# Patient Record
Sex: Female | Born: 1995 | Race: Black or African American | Hispanic: No | Marital: Single | Smoking: Never smoker
Health system: Southern US, Community
[De-identification: ages and names within clinical notes are randomized; demographics above are authoritative.]

## PROBLEM LIST (undated history)

## (undated) DIAGNOSIS — F329 Major depressive disorder, single episode, unspecified: Secondary | ICD-10-CM

## (undated) DIAGNOSIS — F32A Depression, unspecified: Secondary | ICD-10-CM

## (undated) DIAGNOSIS — I2699 Other pulmonary embolism without acute cor pulmonale: Secondary | ICD-10-CM

## (undated) HISTORY — PX: NO PAST SURGERIES: SHX2092

---

## 2016-06-16 ENCOUNTER — Encounter: Payer: Self-pay | Admitting: Gynecology

## 2016-06-16 ENCOUNTER — Ambulatory Visit (INDEPENDENT_AMBULATORY_CARE_PROVIDER_SITE_OTHER): Payer: BLUE CROSS/BLUE SHIELD | Admitting: Gynecology

## 2016-06-16 VITALS — BP 120/78 | Ht 66.5 in | Wt 169.0 lb

## 2016-06-16 DIAGNOSIS — Z01419 Encounter for gynecological examination (general) (routine) without abnormal findings: Secondary | ICD-10-CM | POA: Diagnosis not present

## 2016-06-16 DIAGNOSIS — B3731 Acute candidiasis of vulva and vagina: Secondary | ICD-10-CM

## 2016-06-16 DIAGNOSIS — N898 Other specified noninflammatory disorders of vagina: Secondary | ICD-10-CM | POA: Diagnosis not present

## 2016-06-16 DIAGNOSIS — Z113 Encounter for screening for infections with a predominantly sexual mode of transmission: Secondary | ICD-10-CM

## 2016-06-16 DIAGNOSIS — B373 Candidiasis of vulva and vagina: Secondary | ICD-10-CM

## 2016-06-16 LAB — WET PREP FOR TRICH, YEAST, CLUE
CLUE CELLS WET PREP: NONE SEEN
Trich, Wet Prep: NONE SEEN

## 2016-06-16 LAB — CBC WITH DIFFERENTIAL/PLATELET
BASOS PCT: 0 %
Basophils Absolute: 0 cells/uL (ref 0–200)
EOS ABS: 284 {cells}/uL (ref 15–500)
Eosinophils Relative: 4 %
HEMATOCRIT: 37.9 % (ref 34.0–46.0)
HEMOGLOBIN: 12.5 g/dL (ref 11.5–15.3)
LYMPHS ABS: 1704 {cells}/uL (ref 1200–5200)
LYMPHS PCT: 24 %
MCH: 30.6 pg (ref 25.0–35.0)
MCHC: 33 g/dL (ref 31.0–36.0)
MCV: 92.9 fL (ref 78.0–98.0)
MONO ABS: 710 {cells}/uL (ref 200–900)
MPV: 10.9 fL (ref 7.5–12.5)
Monocytes Relative: 10 %
NEUTROS PCT: 62 %
Neutro Abs: 4402 cells/uL (ref 1800–8000)
Platelets: 197 10*3/uL (ref 140–400)
RBC: 4.08 MIL/uL (ref 3.80–5.10)
RDW: 13.3 % (ref 11.0–15.0)
WBC: 7.1 10*3/uL (ref 4.5–13.0)

## 2016-06-16 LAB — URINALYSIS W MICROSCOPIC + REFLEX CULTURE
BILIRUBIN URINE: NEGATIVE
Casts: NONE SEEN [LPF]
Crystals: NONE SEEN [HPF]
GLUCOSE, UA: NEGATIVE
HGB URINE DIPSTICK: NEGATIVE
KETONES UR: NEGATIVE
LEUKOCYTES UA: NEGATIVE
NITRITE: NEGATIVE
PH: 6 (ref 5.0–8.0)
PROTEIN: NEGATIVE
RBC / HPF: NONE SEEN RBC/HPF (ref <=2)
Specific Gravity, Urine: 1.031 (ref 1.001–1.035)

## 2016-06-16 MED ORDER — FLUCONAZOLE 150 MG PO TABS: 150.0000 mg | ORAL_TABLET | Freq: Once | ORAL | Status: DC

## 2016-06-16 NOTE — Patient Instructions (Addendum)
Breast Self-Awareness Practicing breast self-awareness may pick up problems early, prevent significant medical complications, and possibly save your life. By practicing breast self-awareness, you can become familiar with how your breasts look and feel and if your breasts are changing. This allows you to notice changes early. It can also offer you some reassurance that your breast health is good. One way to learn what is normal for your breasts and whether your breasts are changing is to do a breast self-exam. If you find a lump or something that was not present in the past, it is best to contact your caregiver right away. Other findings that should be evaluated by your caregiver include nipple discharge, especially if it is bloody; skin changes or reddening; areas where the skin seems to be pulled in (retracted); or new lumps and bumps. Breast pain is seldom associated with cancer (malignancy), but should also be evaluated by a caregiver. HOW TO PERFORM A BREAST SELF-EXAM The best time to examine your breasts is 5-7 days after your menstrual period is over. During menstruation, the breasts are lumpier, and it may be more difficult to pick up changes. If you do not menstruate, have reached menopause, or had your uterus removed (hysterectomy), you should examine your breasts at regular intervals, such as monthly. If you are breastfeeding, examine your breasts after a feeding or after using a breast pump. Breast implants do not decrease the risk for lumps or tumors, so continue to perform breast self-exams as recommended. Talk to your caregiver about how to determine the difference between the implant and breast tissue. Also, talk about the amount of pressure you should use during the exam. Over time, you will become more familiar with the variations of your breasts and more comfortable with the exam. A breast self-exam requires you to remove all your clothes above the waist.  Look at your breasts and nipples.  Stand in front of a mirror in a room with good lighting. With your hands on your hips, push your hands firmly downward. Look for a difference in shape, contour, and size from one breast to the other (asymmetry). Asymmetry includes puckers, dips, or bumps. Also, look for skin changes, such as reddened or scaly areas on the breasts. Look for nipple changes, such as discharge, dimpling, repositioning, or redness.  Carefully feel your breasts. This is best done either in the shower or tub while using soapy water or when flat on your back. Place the arm (on the side of the breast you are examining) above your head. Use the pads (not the fingertips) of your three middle fingers on your opposite hand to feel your breasts. Start in the underarm area and use  inch (2 cm) overlapping circles to feel your breast. Use 3 different levels of pressure (light, medium, and firm pressure) at each circle before moving to the next circle. The light pressure is needed to feel the tissue closest to the skin. The medium pressure will help to feel breast tissue a little deeper, while the firm pressure is needed to feel the tissue close to the ribs. Continue the overlapping circles, moving downward over the breast until you feel your ribs below your breast. Then, move one finger-width towards the center of the body. Continue to use the  inch (2 cm) overlapping circles to feel your breast as you move slowly up toward the collar bone (clavicle) near the base of the neck. Continue the up and down exam using all 3 pressures until you reach the  middle of the chest. Do this with each breast, carefully feeling for lumps or changes.   Keep a written record with breast changes or normal findings for each breast. By writing this information down, you do not need to depend only on memory for size, tenderness, or location. Write down where you are in your menstrual cycle, if you are still menstruating. Breast tissue can have some lumps or thick  tissue. However, see your caregiver if you find anything that concerns you.  SEEK MEDICAL CARE IF:  You see a change in shape, contour, or size of your breasts or nipples.   You see skin changes, such as reddened or scaly areas on the breasts or nipples.   You have an unusual discharge from your nipples.   You feel a new lump or unusually thick areas.    This information is not intended to replace advice given to you by your health care provider. Make sure you discuss any questions you have with your health care provider.   Document Released: 12/06/2005 Document Revised: 11/22/2012 Document Reviewed: 03/22/2012 Elsevier Interactive Patient Education 2016 Elsevier Inc. Fluconazole tablets What is this medicine? FLUCONAZOLE (floo KON na zole) is an antifungal medicine. It is used to treat certain kinds of fungal or yeast infections. This medicine may be used for other purposes; ask your health care provider or pharmacist if you have questions. What should I tell my health care provider before I take this medicine? They need to know if you have any of these conditions: -electrolyte abnormalities -history of irregular heart beat -kidney disease -an unusual or allergic reaction to fluconazole, other azole antifungals, medicines, foods, dyes, or preservatives -pregnant or trying to get pregnant -breast-feeding How should I use this medicine? Take this medicine by mouth. Follow the directions on the prescription label. Do not take your medicine more often than directed. Talk to your pediatrician regarding the use of this medicine in children. Special care may be needed. This medicine has been used in children as young as 746 months of age. Overdosage: If you think you have taken too much of this medicine contact a poison control center or emergency room at once. NOTE: This medicine is only for you. Do not share this medicine with others. What if I miss a dose? If you miss a dose, take  it as soon as you can. If it is almost time for your next dose, take only that dose. Do not take double or extra doses. What may interact with this medicine? Do not take this medicine with any of the following medications: -astemizole -certain medicines for irregular heart beat like dofetilide, dronedarone, quinidine -cisapride -erythromycin -lomitapide -other medicines that prolong the QT interval (cause an abnormal heart rhythm) -pimozide -terfenadine -thioridazine -tolvaptan -ziprasidone This medicine may also interact with the following medications: -antiviral medicines for HIV or AIDS -birth control pills -certain antibiotics like rifabutin, rifampin -certain medicines for blood pressure like amlodipine, isradipine, felodipine, hydrochlorothiazide, losartan, nifedipine -certain medicines for cancer like cyclophosphamide, vinblastine, vincristine -certain medicines for cholesterol like atorvastatin, lovastatin, fluvastatin, simvastatin -certain medicines for depression, anxiety, or psychotic disturbances like amitriptyline, midazolam, nortriptyline, triazolam -certain medicines for diabetes like glipizide, glyburide, tolbutamide -certain medicines for pain like alfentanil, fentanyl, methadone -certain medicines for seizures like carbamazepine, phenytoin -certain medicines that treat or prevent blood clots like warfarin -halofantrine -medicines that lower your chance of fighting infection like cyclosporine, prednisone, tacrolimus -NSAIDS, medicines for pain and inflammation, like celecoxib, diclofenac, flurbiprofen, ibuprofen, meloxicam, naproxen -other medicines  for fungal infections -sirolimus -theophylline -tofacitinib This list may not describe all possible interactions. Give your health care provider a list of all the medicines, herbs, non-prescription drugs, or dietary supplements you use. Also tell them if you smoke, drink alcohol, or use illegal drugs. Some items may  interact with your medicine. What should I watch for while using this medicine? Visit your doctor or health care professional for regular checkups. If you are taking this medicine for a long time you may need blood work. Tell your doctor if your symptoms do not improve. Some fungal infections need many weeks or months of treatment to cure. Alcohol can increase possible damage to your liver. Avoid alcoholic drinks. If you have a vaginal infection, do not have sex until you have finished your treatment. You can wear a sanitary napkin. Do not use tampons. Wear freshly washed cotton, not synthetic, panties. What side effects may I notice from receiving this medicine? Side effects that you should report to your doctor or health care professional as soon as possible: -allergic reactions like skin rash or itching, hives, swelling of the lips, mouth, tongue, or throat -dark urine -feeling dizzy or faint -irregular heartbeat or chest pain -redness, blistering, peeling or loosening of the skin, including inside the mouth -trouble breathing -unusual bruising or bleeding -vomiting -yellowing of the eyes or skin Side effects that usually do not require medical attention (report to your doctor or health care professional if they continue or are bothersome): -changes in how food tastes -diarrhea -headache -stomach upset or nausea This list may not describe all possible side effects. Call your doctor for medical advice about side effects. You may report side effects to FDA at 1-800-FDA-1088. Where should I keep my medicine? Keep out of the reach of children. Store at room temperature below 30 degrees C (86 degrees F). Throw away any medicine after the expiration date. NOTE: This sheet is a summary. It may not cover all possible information. If you have questions about this medicine, talk to your doctor, pharmacist, or health care provider.    2016, Elsevier/Gold Standard. (2013-07-14 16:13:04) Monilial  Vaginitis Vaginitis in a soreness, swelling and redness (inflammation) of the vagina and vulva. Monilial vaginitis is not a sexually transmitted infection. CAUSES  Yeast vaginitis is caused by yeast (candida) that is normally found in your vagina. With a yeast infection, the candida has overgrown in number to a point that upsets the chemical balance. SYMPTOMS   White, thick vaginal discharge.  Swelling, itching, redness and irritation of the vagina and possibly the lips of the vagina (vulva).  Burning or painful urination.  Painful intercourse. DIAGNOSIS  Things that may contribute to monilial vaginitis are:  Postmenopausal and virginal states.  Pregnancy.  Infections.  Being tired, sick or stressed, especially if you had monilial vaginitis in the past.  Diabetes. Good control will help lower the chance.  Birth control pills.  Tight fitting garments.  Using bubble bath, feminine sprays, douches or deodorant tampons.  Taking certain medications that kill germs (antibiotics).  Sporadic recurrence can occur if you become ill. TREATMENT  Your caregiver will give you medication.  There are several kinds of anti monilial vaginal creams and suppositories specific for monilial vaginitis. For recurrent yeast infections, use a suppository or cream in the vagina 2 times a week, or as directed.  Anti-monilial or steroid cream for the itching or irritation of the vulva may also be used. Get your caregiver's permission.  Painting the vagina with methylene blue  solution may help if the monilial cream does not work.  Eating yogurt may help prevent monilial vaginitis. HOME CARE INSTRUCTIONS   Finish all medication as prescribed.  Do not have sex until treatment is completed or after your caregiver tells you it is okay.  Take warm sitz baths.  Do not douche.  Do not use tampons, especially scented ones.  Wear cotton underwear.  Avoid tight pants and panty hose.  Tell your  sexual partner that you have a yeast infection. They should go to their caregiver if they have symptoms such as mild rash or itching.  Your sexual partner should be treated as well if your infection is difficult to eliminate.  Practice safer sex. Use condoms.  Some vaginal medications cause latex condoms to fail. Vaginal medications that harm condoms are:  Cleocin cream.  Butoconazole (Femstat).  Terconazole (Terazol) vaginal suppository.  Miconazole (Monistat) (may be purchased over the counter). SEEK MEDICAL CARE IF:   You have a temperature by mouth above 102 F (38.9 C).  The infection is getting worse after 2 days of treatment.  The infection is not getting better after 3 days of treatment.  You develop blisters in or around your vagina.  You develop vaginal bleeding, and it is not your menstrual period.  You have pain when you urinate.  You develop intestinal problems.  You have pain with sexual intercourse.   This information is not intended to replace advice given to you by your health care provider. Make sure you discuss any questions you have with your health care provider.   Document Released: 09/15/2005 Document Revised: 02/28/2012 Document Reviewed: 06/09/2015 Elsevier Interactive Patient Education [employer]2016 Elsevier Inc.

## 2016-06-16 NOTE — Progress Notes (Signed)
Victoria Sanders [birth year] [id number]   History:    20 y.o.  who presents to the office today for her first gynecological exam. Patient is sexually active. She's had 4 partners in the past. She reports normal menstrual cycles lasting 3-4 days. She sometimes uses condoms for contraception. She was here also complaining of a clear vaginal discharge without any odor and some slight vulvar pruritus. She is otherwise with no other medical problems. She has never received the HPV vaccine series in the past.  Past medical history,surgical history, family history and social history were all reviewed and documented in the EPIC chart.  Gynecologic History Patient's last menstrual period was 05/16/2016. Contraception: none Last Pap: Not indicated. Results were: Not indicated Last mammogram: Not indicated. Results were: Not indicated  Obstetric History OB History  Gravida Para Term Preterm AB SAB TAB Ectopic Multiple Living  0 0 0 0 0 0 0 0 0 0          ROS: A ROS was performed and pertinent positives and negatives are included in the history.  GENERAL: No fevers or chills. HEENT: No change in vision, no earache, sore throat or sinus congestion. NECK: No pain or stiffness. CARDIOVASCULAR: No chest pain or pressure. No palpitations. PULMONARY: No shortness of breath, cough or wheeze. GASTROINTESTINAL: No abdominal pain, nausea, vomiting or diarrhea, melena or bright red blood per rectum. GENITOURINARY: No urinary frequency, urgency, hesitancy or dysuria. MUSCULOSKELETAL: No joint or muscle pain, no back pain, no recent trauma. DERMATOLOGIC: No rash, no itching, no lesions. ENDOCRINE: No polyuria, polydipsia, no heat or cold intolerance. No recent change in weight. HEMATOLOGICAL: No anemia or easy bruising or bleeding. NEUROLOGIC: No headache, seizures, numbness, tingling or weakness. PSYCHIATRIC: No depression, no loss of interest in normal activity or change in sleep pattern.     Exam: chaperone  present  BP 120/78 mmHg  Ht 5' 6.5" (1.689 m)  Wt 169 lb (76.658 kg)  BMI 26.87 kg/m2  LMP 05/16/2016  Body mass index is 26.87 kg/(m^2).  General appearance : Well developed well nourished female. No acute distress HEENT: Eyes: no retinal hemorrhage or exudates,  Neck supple, trachea midline, no carotid bruits, no thyroidmegaly Lungs: Clear to auscultation, no rhonchi or wheezes, or rib retractions  Heart: Regular rate and rhythm, no murmurs or gallops Breast:Examined in sitting and supine position were symmetrical in appearance, no palpable masses or tenderness,  no skin retraction, no nipple inversion, no nipple discharge, no skin discoloration, no axillary or supraclavicular lymphadenopathy Abdomen: no palpable masses or tenderness, no rebound or guarding Extremities: no edema or skin discoloration or tenderness  Pelvic:  Bartholin, Urethra, Skene Glands: Within normal limits             Vagina: Clear discharge  Cervix: No gross lesions or discharge  Uterus  anteverted, normal size, shape and consistency, non-tender and mobile  Adnexa  Without masses or tenderness  Anus and perineum  normal   Rectovaginal  normal sphincter tone without palpated masses or tenderness             Hemoccult not indicated   Wet prep moderate yeast, few WBC, few bacteria  Assessment/Plan:  20 y.o. female for annual exam with yeast vaginitis will be prescribed Diflucan 150 mg 1 by mouth today. A GC and Chlamydia culture was obtained and to complete a full STD screen we'll obtain an HIV, RPR, hepatitis B and C today. Patient with several tattoos on her back and also body piercing.  Pap smear not indicated. A CBC and urinalysis will be done today. Patient was given instruction monthly self breast examination. We discussed different types of contraception. Literature information was provided on the [place] IUD as well as on the HPV vaccine series.   Ok EdwardsFERNANDEZ,[name] MD, 9:52 AM 06/16/2016

## 2016-06-17 LAB — GC/CHLAMYDIA PROBE AMP
CT Probe RNA: NOT DETECTED
GC Probe RNA: NOT DETECTED

## 2016-06-17 LAB — URINE CULTURE

## 2016-06-17 LAB — HEPATITIS C ANTIBODY: HCV Ab: NEGATIVE

## 2016-06-17 LAB — HEPATITIS B SURFACE ANTIGEN: Hepatitis B Surface Ag: NEGATIVE

## 2016-06-17 LAB — RPR

## 2016-06-17 LAB — HIV ANTIBODY (ROUTINE TESTING W REFLEX): HIV 1&2 Ab, 4th Generation: NONREACTIVE

## 2016-06-28 ENCOUNTER — Telehealth: Payer: Self-pay | Admitting: Gynecology

## 2016-06-28 NOTE — Telephone Encounter (Signed)
06/28/16-[facility] with a $25 copay. Advised insertion while on cycle. Also, pt wanted info on Gardisil vaccination. Her BC will cover the shots with $25 copay for each injection. If one is done same day as Skyla insert, there would be 2-$25 copays. Pt was given this information and will call if she wishes to proceed. Per [id number]/[email or web address]

## 2016-11-13 ENCOUNTER — Encounter (HOSPITAL_COMMUNITY): Payer: Self-pay | Admitting: *Deleted

## 2016-11-13 ENCOUNTER — Inpatient Hospital Stay (HOSPITAL_COMMUNITY)
Admission: AD | Admit: 2016-11-13 | Discharge: 2016-11-16 | DRG: 885 | Disposition: A | Discharge status: Home / Self Care | Payer: Federal, State, Local not specified - Other | Source: Intra-hospital | Attending: Psychiatry | Admitting: Psychiatry

## 2016-11-13 ENCOUNTER — Emergency Department (HOSPITAL_COMMUNITY)
Admission: EM | Admit: 2016-11-13 | Discharge: 2016-11-13 | Disposition: A | Discharge status: Home / Self Care | Payer: Self-pay | Attending: Emergency Medicine | Admitting: Emergency Medicine

## 2016-11-13 DIAGNOSIS — F329 Major depressive disorder, single episode, unspecified: Secondary | ICD-10-CM | POA: Insufficient documentation

## 2016-11-13 DIAGNOSIS — G4700 Insomnia, unspecified: Secondary | ICD-10-CM | POA: Diagnosis present

## 2016-11-13 DIAGNOSIS — Z79899 Other long term (current) drug therapy: Secondary | ICD-10-CM | POA: Insufficient documentation

## 2016-11-13 DIAGNOSIS — Z833 Family history of diabetes mellitus: Secondary | ICD-10-CM

## 2016-11-13 DIAGNOSIS — R45851 Suicidal ideations: Secondary | ICD-10-CM | POA: Insufficient documentation

## 2016-11-13 DIAGNOSIS — Z801 Family history of malignant neoplasm of trachea, bronchus and lung: Secondary | ICD-10-CM | POA: Diagnosis not present

## 2016-11-13 DIAGNOSIS — Z809 Family history of malignant neoplasm, unspecified: Secondary | ICD-10-CM

## 2016-11-13 DIAGNOSIS — Z808 Family history of malignant neoplasm of other organs or systems: Secondary | ICD-10-CM | POA: Diagnosis not present

## 2016-11-13 DIAGNOSIS — Z823 Family history of stroke: Secondary | ICD-10-CM | POA: Diagnosis not present

## 2016-11-13 DIAGNOSIS — F32A Depression, unspecified: Secondary | ICD-10-CM

## 2016-11-13 DIAGNOSIS — F332 Major depressive disorder, recurrent severe without psychotic features: Principal | ICD-10-CM | POA: Diagnosis present

## 2016-11-13 HISTORY — DX: Depression, unspecified: F32.A

## 2016-11-13 HISTORY — DX: Major depressive disorder, single episode, unspecified: F32.9

## 2016-11-13 LAB — CBC
HEMATOCRIT: 41.1 % (ref 36.0–46.0)
HEMOGLOBIN: 13.9 g/dL (ref 12.0–15.0)
MCH: 31.4 pg (ref 26.0–34.0)
MCHC: 33.8 g/dL (ref 30.0–36.0)
MCV: 92.8 fL (ref 78.0–100.0)
Platelets: 182 10*3/uL (ref 150–400)
RBC: 4.43 MIL/uL (ref 3.87–5.11)
RDW: 12 % (ref 11.5–15.5)
WBC: 5.8 10*3/uL (ref 4.0–10.5)

## 2016-11-13 LAB — COMPREHENSIVE METABOLIC PANEL
ALBUMIN: 4.3 g/dL (ref 3.5–5.0)
ALK PHOS: 43 U/L (ref 38–126)
ALT: 14 U/L (ref 14–54)
AST: 17 U/L (ref 15–41)
Anion gap: 7 (ref 5–15)
BILIRUBIN TOTAL: 0.6 mg/dL (ref 0.3–1.2)
BUN: 16 mg/dL (ref 6–20)
CALCIUM: 9.2 mg/dL (ref 8.9–10.3)
CO2: 26 mmol/L (ref 22–32)
CREATININE: 0.8 mg/dL (ref 0.44–1.00)
Chloride: 106 mmol/L (ref 101–111)
GFR calc Af Amer: >60 mL/min (ref >60)
GFR calc non Af Amer: >60 mL/min (ref >60)
GLUCOSE: 89 mg/dL (ref 65–99)
Potassium: 4.1 mmol/L (ref 3.5–5.1)
SODIUM: 139 mmol/L (ref 135–145)
TOTAL PROTEIN: 6.9 g/dL (ref 6.5–8.1)

## 2016-11-13 LAB — RAPID URINE DRUG SCREEN, HOSP PERFORMED
Amphetamines: NOT DETECTED
BARBITURATES: NOT DETECTED
Benzodiazepines: NOT DETECTED
Cocaine: NOT DETECTED
Opiates: NOT DETECTED
TETRAHYDROCANNABINOL: NOT DETECTED

## 2016-11-13 LAB — ACETAMINOPHEN LEVEL: Acetaminophen (Tylenol), Serum: <10 ug/mL — ABNORMAL LOW (ref 10–30)

## 2016-11-13 LAB — SALICYLATE LEVEL: Salicylate Lvl: <7 mg/dL (ref 2.8–30.0)

## 2016-11-13 LAB — I-STAT BETA HCG BLOOD, ED (MC, WL, AP ONLY)

## 2016-11-13 LAB — ETHANOL: Alcohol, Ethyl (B): <5 mg/dL (ref <5)

## 2016-11-13 MED ORDER — ALUM & MAG HYDROXIDE-SIMETH 200-200-20 MG/5ML PO SUSP: 30.0000 mL | ORAL | Status: DC | PRN

## 2016-11-13 MED ORDER — ACETAMINOPHEN 325 MG PO TABS: 650.0000 mg | ORAL_TABLET | Freq: Four times a day (QID) | ORAL | Status: DC | PRN

## 2016-11-13 MED ORDER — HYDROXYZINE HCL 25 MG PO TABS
25.0000 mg | ORAL_TABLET | Freq: Three times a day (TID) | ORAL | Status: DC | PRN
  Filled 2016-11-13: qty 10

## 2016-11-13 MED ORDER — NICOTINE 21 MG/24HR TD PT24: 21.0000 mg | MEDICATED_PATCH | Freq: Every day | TRANSDERMAL | Status: DC | PRN

## 2016-11-13 MED ORDER — ZOLPIDEM TARTRATE 5 MG PO TABS: 5.0000 mg | ORAL_TABLET | Freq: Every evening | ORAL | Status: DC | PRN

## 2016-11-13 MED ORDER — IBUPROFEN 200 MG PO TABS: 600.0000 mg | ORAL_TABLET | Freq: Three times a day (TID) | ORAL | Status: DC | PRN

## 2016-11-13 MED ORDER — TRAZODONE HCL 50 MG PO TABS
50.0000 mg | ORAL_TABLET | Freq: Every evening | ORAL | Status: DC | PRN
  Filled 2016-11-13: qty 7

## 2016-11-13 MED ORDER — ONDANSETRON HCL 4 MG PO TABS: 4.0000 mg | ORAL_TABLET | Freq: Three times a day (TID) | ORAL | Status: DC | PRN

## 2016-11-13 MED ORDER — ACETAMINOPHEN 325 MG PO TABS: 650.0000 mg | ORAL_TABLET | ORAL | Status: DC | PRN

## 2016-11-13 MED ORDER — MAGNESIUM HYDROXIDE 400 MG/5ML PO SUSP: 30.0000 mL | Freq: Every day | ORAL | Status: DC | PRN

## 2016-11-13 NOTE — ED Notes (Signed)
Security at bedside wanding pt 
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
## 2016-11-13 NOTE — Tx Team (Signed)
Initial Treatment Plan 11/13/2016 6:00 PM [name] Kinchen [facility]:[id number]    PATIENT STRESSORS: Educational concerns Marital or family conflict Occupational concerns   PATIENT STRENGTHS: Ability for insight Average or above average intelligence Communication skills General fund of knowledge Physical Health   PATIENT IDENTIFIED PROBLEMS: At risk for suicide  Depression  Anxiety  "Feeling better"  "I want to go home"             DISCHARGE CRITERIA:  Ability to meet basic life and health needs Improved stabilization in mood, thinking, and/or behavior Motivation to continue treatment in a less acute level of care Need for constant or close observation no longer present  PRELIMINARY DISCHARGE PLAN: Outpatient therapy Return to previous living arrangement Return to previous work or school arrangements  PATIENT/FAMILY INVOLVEMENT: This treatment plan has been presented to and reviewed with the patient, [name] Schnepf.  The patient and family have been given the opportunity to ask questions and make suggestions.  Carleene OverlieMiddleton, Deedee Lybarger P, RN 11/13/2016, 6:00 PM

## 2016-11-13 NOTE — ED Triage Notes (Signed)
Pt is tearful at triage. Reports having depression since 20yo and having suicidal thoughts today but denies any attempts.

## 2016-11-13 NOTE — ED Notes (Signed)
Faxed voluntary commitment form

## 2016-11-13 NOTE — Progress Notes (Signed)
D    Saw pt in her room briefly   She is appropriate and pleasant   She endorses depression and anxiety   She interacts appropriately with others A    Verbal support given   Medications administered and effectiveness monitored    Q 15 min checks R   Pt safe at present

## 2016-11-13 NOTE — ED Notes (Signed)
Attempted report x1. 

## 2016-11-13 NOTE — ED Notes (Signed)
Transport called - [facility] to arrive in 20 mins

## 2016-11-13 NOTE — ED Notes (Signed)
[facility] arrived. Patient picked up.

## 2016-11-13 NOTE — ED Notes (Signed)
Security notified to wand patient.  

## 2016-11-13 NOTE — ED Notes (Signed)
Attempted report x 2 

## 2016-11-13 NOTE — ED Notes (Signed)
Sitter at bedside.
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
## 2016-11-13 NOTE — ED Provider Notes (Signed)
[facility]-EMERGENCY DEPT Provider Note   CSN: [id number] Arrival date & time: 11/13/16  [id number]     History   Chief Complaint Chief Complaint  Patient presents with  . Suicidal  . Depression    HPI Victoria Sanders is a 20 y.o. female.  She presents for evaluation of "depression". She states that it is "getting worse". She last saw her therapist about 2 months ago, but stopped because she "did not like it". She lives with her parents. She goes to school and is employed. She has a boyfriend, but admits that she is not getting along with him. She has had thoughts of suicide, but has no plan. She has no thoughts of hurting anyone else. She is not taking medicine for depression. Depression has been an ongoing problem for 5 years. There are no other known modifying factors.     HPI  Past Medical History:  Diagnosis Date  . Depression     There are no active problems to display for this patient.   History reviewed. No pertinent surgical history.  OB History    Gravida Para Term Preterm AB Living   0 0 0 0 0 0   SAB TAB Ectopic Multiple Live Births   0 0 0 0         Home Medications    Prior to Admission medications   Medication Sig Start Date End Date Taking? Authorizing Provider  fluconazole (DIFLUCAN) 150 MG tablet Take 1 tablet (150 mg total) by mouth once. Patient not taking: Reported on 11/13/2016 06/16/16   Ok EdwardsJuan H Fernandez, MD    Family History Family History  Problem Relation Age of Onset  . Diabetes Maternal Uncle   . Stroke Maternal Uncle   . Cancer Paternal Grandmother     BRAIN TUMOR   . Diabetes Paternal Grandmother   . Cancer Paternal Grandfather     LUNG     Social History Social History  Substance Use Topics  . Smoking status: Never Smoker  . Smokeless tobacco: Not on file  . Alcohol use No     Allergies   Patient has no known allergies.   Review of Systems Review of Systems  All other systems reviewed and are negative.    Physical  Exam Updated Vital Signs BP 122/69 (BP Location: Right Arm)   Pulse 70   Temp 98.4 F (36.9 C) (Oral)   Resp 18   Ht 5\' 7"  (1.702 m)   Wt 160 lb (72.6 kg)   LMP 10/14/2016   SpO2 100%   BMI 25.06 kg/m   Physical Exam  Constitutional: She is oriented to person, place, and time. She appears well-developed and well-nourished.  HENT:  Head: Normocephalic and atraumatic.  Right Ear: External ear normal.  Left Ear: External ear normal.  Eyes: Conjunctivae and EOM are normal. Pupils are equal, round, and reactive to light.  Neck: Normal range of motion and phonation normal. Neck supple.  Cardiovascular: Normal rate.   Pulmonary/Chest: Effort normal. She exhibits no bony tenderness.  Musculoskeletal: Normal range of motion.  Neurological: She is alert and oriented to person, place, and time. No cranial nerve deficit or sensory deficit. She exhibits normal muscle tone. Coordination normal.  Skin: Skin is warm, dry and intact.  Psychiatric: Her behavior is normal. Judgment and thought content normal.  Appears depressed. Speaks quietly, but is cooperative. No overt psychosis.  Nursing note and vitals reviewed.    ED Treatments / Results  Labs (all labs ordered are  listed, but only abnormal results are displayed) Labs Reviewed  ACETAMINOPHEN LEVEL - Abnormal; Notable for the following:       Result Value   Acetaminophen (Tylenol), Serum <10 (*)    All other components within normal limits  COMPREHENSIVE METABOLIC PANEL  ETHANOL  SALICYLATE LEVEL  CBC  RAPID URINE DRUG SCREEN, HOSP PERFORMED  I-STAT BETA HCG BLOOD, ED ([facility], [facility], [facility] ONLY)    EKG  EKG Interpretation None       Radiology No results found.  Procedures Procedures (including critical care time)  Medications Ordered in ED Medications  acetaminophen (TYLENOL) tablet 650 mg (not administered)  ibuprofen (ADVIL,MOTRIN) tablet 600 mg (not administered)  zolpidem (AMBIEN) tablet 5 mg (not administered)    nicotine (NICODERM CQ - dosed in mg/24 hours) patch 21 mg (not administered)  ondansetron (ZOFRAN) tablet 4 mg (not administered)  alum & mag hydroxide-simeth (MAALOX/MYLANTA) 200-200-20 MG/5ML suspension 30 mL (not administered)     Initial Impression / Assessment and Plan / ED Course  I have reviewed the triage vital signs and the nursing notes.  Pertinent labs & imaging results that were available during my care of the patient were reviewed by me and considered in my medical decision making (see chart for details).  Clinical Course     Medications  acetaminophen (TYLENOL) tablet 650 mg (not administered)  ibuprofen (ADVIL,MOTRIN) tablet 600 mg (not administered)  zolpidem (AMBIEN) tablet 5 mg (not administered)  nicotine (NICODERM CQ - dosed in mg/24 hours) patch 21 mg (not administered)  ondansetron (ZOFRAN) tablet 4 mg (not administered)  alum & mag hydroxide-simeth (MAALOX/MYLANTA) 200-200-20 MG/5ML suspension 30 mL (not administered)    Patient Vitals for the past 24 hrs:  BP Temp Temp src Pulse Resp SpO2 Height Weight  11/13/16 1032 122/69 - - 70 18 100 % - -  11/13/16 1006 125/82 98.4 F (36.9 C) Oral 83 18 100 % 5\' 7"  (1.702 m) 160 lb (72.6 kg)    12:04 PM Reevaluation with update and discussion. After initial assessment and treatment, an updated evaluation reveals No change in clinical status. Patient and family updated on findings.Mancel Bale. Adonnis Salceda L   TTS consultation   Final Clinical Impressions(s) / ED Diagnoses   Final diagnoses:  Suicidal ideation  Depression, unspecified depression type    Untreated depression, with  Suicidal ideation, and psychosocial stressors.  Nursing Notes Reviewed/ Care Coordinated, and agree without changes. Applicable Imaging Reviewed.  Interpretation of Laboratory Data incorporated into ED treatment  Plan- as per her TTS in conjunction with oncoming provider team  New Prescriptions New Prescriptions   No medications on  file     Mancel BaleElliott Blakeley Margraf, MD 11/13/16 1206

## 2016-11-13 NOTE — [facility] Group Notes (Signed)
Pt attended group. Rules of the of the unit were explained. Pt was asked how was your day on a scale of 1-10. Pt stated it was a 9 just seeing family.

## 2016-11-14 DIAGNOSIS — Z833 Family history of diabetes mellitus: Secondary | ICD-10-CM

## 2016-11-14 DIAGNOSIS — Z823 Family history of stroke: Secondary | ICD-10-CM

## 2016-11-14 DIAGNOSIS — F332 Major depressive disorder, recurrent severe without psychotic features: Principal | ICD-10-CM

## 2016-11-14 DIAGNOSIS — Z79899 Other long term (current) drug therapy: Secondary | ICD-10-CM

## 2016-11-14 DIAGNOSIS — Z801 Family history of malignant neoplasm of trachea, bronchus and lung: Secondary | ICD-10-CM

## 2016-11-14 DIAGNOSIS — Z808 Family history of malignant neoplasm of other organs or systems: Secondary | ICD-10-CM

## 2016-11-14 DIAGNOSIS — R45851 Suicidal ideations: Secondary | ICD-10-CM

## 2016-11-14 MED ORDER — ESCITALOPRAM OXALATE 10 MG PO TABS
10.0000 mg | ORAL_TABLET | Freq: Every day | ORAL | Status: DC
  Administered 2016-11-14 – 2016-11-16 (×3): 10 mg via ORAL
  Filled 2016-11-14 (×3): qty 1
  Filled 2016-11-14: qty 7
  Filled 2016-11-14: qty 1

## 2016-11-14 NOTE — [facility] Group Notes (Signed)
Adult Therapy Group Note  Date: 11/14/2016  Time:  9:00-10:00AM  Group Topic/Focus: Healthy Press photographerupport Systems   Building Self Esteem:   The focus of this group was to assist patients in identifying their current healthy and unhealthy supports, then discussing how to add additional healthy supports and reduce existing unhealthy ones.  Some group members were willing to read their "goodbye" letters assigned yesterday, which were goodbye's to whatever unhealthy coping technique they have been thinking about changing.  As they did this, CSW asked the group to work with the reader on identifying healthy supports that could help to achieve the desired change.  The healthy additions included supports such as 12-step groups, individual therapy, psychiatrists, faith activities,  sponsors, group therapy, support groups, classes on mental health, exercise/gym, meditation practice, and more.    Participation Level:  Active  Participation Quality:  Attentive and Sharing  Affect:  Blunted  Cognitive:  Appropriate  Insight: Good  Engagement in Group:  Engaged  Modes of Intervention:  Discussion and Support  Additional Comments:  The patient expressed that a current healthy support is her sister and her parents are unhealthy for her.  She did not talk much.  Lynnell ChadMareida J Grossman-Orr, LCSW 11/14/2016  3:40 PM
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
## 2016-11-14 NOTE — H&P (Signed)
Psychiatric Admission Assessment Adult  Patient Identification: Victoria Sanders MRN:  [id number] Date of Evaluation:  11/14/2016 Chief Complaint:  MDD, severe without psychosis Principal Diagnosis: <principal problem not specified> Diagnosis:   Patient Active Problem List   Diagnosis Date Noted  . MDD (major depressive disorder), recurrent severe, without psychosis ([place]) [F33.2] 11/13/2016   History of Present Illness: Patient is a 20 year old female who is brought in by her mother as patient was experiencing severe depression and having suicidal thoughts.  She admitted that she has symptoms of depression for past few months but lately it is getting worse and intense.  She is feeling hopeless, worthless, isolated, withdrawn and decreased energy.  She has multiple stressors including overwhelming school, stressful job and recently family issues.  Patient lives with her parents but they are currently separated and in the holiday she felt more depressed because parents are not talking to each other.  She admitted poor sleep, racing thoughts, anhedonia, feeling of hopelessness.  She admitted suicidal thoughts and they are ways that she thought about killing herself but she did not elaborate these plans.  Patient denies any paranoia, hallucination, mania, psychosis.  She has never seen psychiatrist and never took any antidepressant.  However she has been in therapy on and off a few months ago.  Patient denies drinking alcohol or using any illegal substances.  Her UDS is negative.  Her pregnancy test is negative.  She has no children and currently not in any relationship.  She is working at [employer] and also enrolled in a beauty school. Associated Signs/Symptoms: Depression Symptoms:  depressed mood, anhedonia, insomnia, fatigue, feelings of worthlessness/guilt, difficulty concentrating, hopelessness, recurrent thoughts of death, suicidal thoughts without plan, anxiety, loss of  energy/fatigue, disturbed sleep, (Hypo) Manic Symptoms:  Irritable Mood, Anxiety Symptoms:  Excessive Worry, Psychotic Symptoms:  Patient denies any psychotic symptoms PTSD Symptoms: NA Total Time spent with patient: 45 minutes  Past Psychiatric History: Patient denies any previous history of psychiatric inpatient treatment, suicidal attempt, mania, psychosis or any hallucination.  She has never taken any psychotropic medication.  Is the patient at risk to self? Yes.    Has the patient been a risk to self in the past 6 months? Yes.    Has the patient been a risk to self within the distant past? No.  Is the patient a risk to others? No.  Has the patient been a risk to others in the past 6 months? No.  Has the patient been a risk to others within the distant past? No.   Prior Inpatient Therapy:   Prior Outpatient Therapy:    Alcohol Screening: 1. How often do you have a drink containing alcohol?: Never 9. Have you or someone else been injured as a result of your drinking?: No 10. Has a relative or friend or a doctor or another health worker been concerned about your drinking or suggested you cut down?: No Alcohol Use Disorder Identification Test Final Score (AUDIT): 0 Brief Intervention: AUDIT score less than 7 or less-screening does not suggest unhealthy drinking-brief intervention not indicated Substance Abuse History in the last 12 months:  No. Consequences of Substance Abuse: Negative Previous Psychotropic Medications: No  Psychological Evaluations: No  Past Medical History:  Past Medical History:  Diagnosis Date  . Depression    History reviewed. No pertinent surgical history. Family History:  Family History  Problem Relation Age of Onset  . Diabetes Maternal Uncle   . Stroke Maternal Uncle   .  Cancer Paternal Grandmother     BRAIN TUMOR   . Diabetes Paternal Grandmother   . Cancer Paternal Grandfather     LUNG    Family Psychiatric  History: Patient denies any  family history of psychiatric illness. Tobacco Screening: Have you used any form of tobacco in the last 30 days? (Cigarettes, Smokeless Tobacco, Cigars, and/or Pipes): No Social History:  History  Alcohol Use No     History  Drug Use No    Additional Social History:                           Allergies:  No Known Allergies Lab Results:  Results for orders placed or performed during the hospital encounter of 11/13/16 (from the past 48 hour(s))  Comprehensive metabolic panel     Status: None   Collection Time: 11/13/16 10:15 AM  Result Value Ref Range   Sodium 139 135 - 145 mmol/L   Potassium 4.1 3.5 - 5.1 mmol/L   Chloride 106 101 - 111 mmol/L   CO2 26 22 - 32 mmol/L   Glucose, Bld 89 65 - 99 mg/dL   BUN 16 6 - 20 mg/dL   Creatinine, Ser 0.80 0.44 - 1.00 mg/dL   Calcium 9.2 8.9 - 10.3 mg/dL   Total Protein 6.9 6.5 - 8.1 g/dL   Albumin 4.3 3.5 - 5.0 g/dL   AST 17 15 - 41 U/L   ALT 14 14 - 54 U/L   Alkaline Phosphatase 43 38 - 126 U/L   Total Bilirubin 0.6 0.3 - 1.2 mg/dL   GFR calc non Af Amer >60 >60 mL/min   GFR calc Af Amer >60 >60 mL/min    Comment: (NOTE) The eGFR has been calculated using the CKD EPI equation. This calculation has not been validated in all clinical situations. eGFR's persistently <60 mL/min signify possible Chronic Kidney Disease.    Anion gap 7 5 - 15  Ethanol     Status: None   Collection Time: 11/13/16 10:15 AM  Result Value Ref Range   Alcohol, Ethyl (B) <5 <5 mg/dL    Comment:        LOWEST DETECTABLE LIMIT FOR SERUM ALCOHOL IS 5 mg/dL FOR MEDICAL PURPOSES ONLY   Salicylate level     Status: None   Collection Time: 11/13/16 10:15 AM  Result Value Ref Range   Salicylate Lvl <2.9 2.8 - 30.0 mg/dL  Acetaminophen level     Status: Abnormal   Collection Time: 11/13/16 10:15 AM  Result Value Ref Range   Acetaminophen (Tylenol), Serum <10 (L) 10 - 30 ug/mL    Comment:        THERAPEUTIC CONCENTRATIONS VARY SIGNIFICANTLY. A RANGE  OF 10-30 ug/mL MAY BE AN EFFECTIVE CONCENTRATION FOR MANY PATIENTS. HOWEVER, SOME ARE BEST TREATED AT CONCENTRATIONS OUTSIDE THIS RANGE. ACETAMINOPHEN CONCENTRATIONS >150 ug/mL AT 4 HOURS AFTER INGESTION AND >50 ug/mL AT 12 HOURS AFTER INGESTION ARE OFTEN ASSOCIATED WITH TOXIC REACTIONS.   cbc     Status: None   Collection Time: 11/13/16 10:15 AM  Result Value Ref Range   WBC 5.8 4.0 - 10.5 K/uL   RBC 4.43 3.87 - 5.11 MIL/uL   Hemoglobin 13.9 12.0 - 15.0 g/dL   HCT 41.1 36.0 - 46.0 %   MCV 92.8 78.0 - 100.0 fL   MCH 31.4 26.0 - 34.0 pg   MCHC 33.8 30.0 - 36.0 g/dL   RDW 12.0 11.5 - 15.5 %  Platelets 182 150 - 400 K/uL  Rapid urine drug screen (hospital performed)     Status: None   Collection Time: 11/13/16 10:15 AM  Result Value Ref Range   Opiates NONE DETECTED NONE DETECTED   Cocaine NONE DETECTED NONE DETECTED   Benzodiazepines NONE DETECTED NONE DETECTED   Amphetamines NONE DETECTED NONE DETECTED   Tetrahydrocannabinol NONE DETECTED NONE DETECTED   Barbiturates NONE DETECTED NONE DETECTED    Comment:        DRUG SCREEN FOR MEDICAL PURPOSES ONLY.  IF CONFIRMATION IS NEEDED FOR ANY PURPOSE, NOTIFY LAB WITHIN 5 DAYS.        LOWEST DETECTABLE LIMITS FOR URINE DRUG SCREEN Drug Class       Cutoff (ng/mL) Amphetamine      1000 Barbiturate      200 Benzodiazepine   109 Tricyclics       323 Opiates          300 Cocaine          300 THC              50   I-Stat beta hCG blood, ED     Status: None   Collection Time: 11/13/16 10:53 AM  Result Value Ref Range   I-stat hCG, quantitative <5.0 <5 mIU/mL   Comment 3            Comment:   GEST. AGE      CONC.  (mIU/mL)   <=1 WEEK        5 - 50     2 WEEKS       50 - 500     3 WEEKS       100 - 10,000     4 WEEKS     1,000 - 30,000        FEMALE AND NON-PREGNANT FEMALE:     LESS THAN 5 mIU/mL     Blood Alcohol level:  Lab Results  Component Value Date   ETH <5 55/73/2202    Metabolic Disorder Labs:  No  results found for: HGBA1C, MPG No results found for: PROLACTIN No results found for: CHOL, TRIG, HDL, CHOLHDL, VLDL, LDLCALC  Current Medications: Current Facility-Administered Medications  Medication Dose Route Frequency Provider Last Rate Last Dose  . acetaminophen (TYLENOL) tablet 650 mg  650 mg Oral Q6H PRN Ethelene Hal, NP      . alum & mag hydroxide-simeth (MAALOX/MYLANTA) 200-200-20 MG/5ML suspension 30 mL  30 mL Oral Q4H PRN Ethelene Hal, NP      . escitalopram (LEXAPRO) tablet 10 mg  10 mg Oral Daily Kathlee Nations, MD      . hydrOXYzine (ATARAX/VISTARIL) tablet 25 mg  25 mg Oral TID PRN Ethelene Hal, NP      . magnesium hydroxide (MILK OF MAGNESIA) suspension 30 mL  30 mL Oral Daily PRN Ethelene Hal, NP      . traZODone (DESYREL) tablet 50 mg  50 mg Oral QHS PRN Ethelene Hal, NP       PTA Medications: Prescriptions Prior to Admission  Medication Sig Dispense Refill Last Dose  . fluconazole (DIFLUCAN) 150 MG tablet Take 1 tablet (150 mg total) by mouth once. (Patient not taking: Reported on 11/13/2016) 1 tablet 1 Completed Course at Unknown time    Musculoskeletal: Strength & Muscle Tone: within normal limits Gait & Station: normal Patient leans: N/A  Psychiatric Specialty Exam: Physical Exam  Review of Systems  Constitutional: Negative.  HENT: Negative.   Eyes: Negative.   Respiratory: Negative.   Cardiovascular: Negative.   Musculoskeletal: Negative.   Skin: Negative.   Neurological: Negative.   Psychiatric/Behavioral: Positive for depression and suicidal ideas. The patient is nervous/anxious and has insomnia.     Blood pressure 111/62, pulse 83, temperature 98.3 F (36.8 C), temperature source Oral, resp. rate 16, height _0  (1.702 m), weight 73 kg (161 lb), last menstrual period 10/14/2016, SpO2 100 %.Body mass index is 25.22 kg/m.  General Appearance: Casual  Eye Contact:  Fair  Speech:  Clear and Coherent  Volume:   Decreased  Mood:  Anxious, Depressed, Dysphoric and Hopeless  Affect:  Constricted and Depressed  Thought Process:  Goal Directed  Orientation:  Full (Time, Place, and Person)  Thought Content:  Rumination  Suicidal Thoughts:  Yes.  without intent/plan  Homicidal Thoughts:  No  Memory:  Immediate;   Fair  Judgement:  Fair  Insight:  Good  Psychomotor Activity:  Decreased  Concentration:  Concentration: Fair and Attention Span: Fair  Recall:  [employer] of Knowledge:  Fair  Language:  Fair  Akathisia:  No  Handed:  Right  AIMS (if indicated):     Assets:  Communication Skills Desire for Improvement Housing Physical Health Resilience Talents/Skills  ADL's:  Intact  Cognition:  WNL  Sleep:  Number of Hours: 6.25    Treatment Plan Summary: Daily contact with patient to assess and evaluate symptoms and progress in treatment and Medication management   Admit for crisis management and stabilization. Medication management to reduce symptoms to baseline and improved the patient's overall level of functioning.  Start Lexapro 10 mg daily .  Closely monitor the side effects, efficacy and therapeutic response of medication. Treat health problem as indicated. Developed treatment plan to decrease the risk of relapse upon discharge and to reduce the need for readmission. Psychosocial education regarding relapse prevention in self-care. Review blood work results.  U tox negative.  Pregnancy test negative.  CBC and basic chemistry is also normal.  Healthcare followup as needed for medical problems and called consults as indicated.   Increase collateral information. Restart home medication where appropriate Encouraged to participate and verbalize into group milieu therapy.   Observation Level/Precautions:  15 minute checks  Laboratory:  CBC Chemistry Profile Folic Acid GGT HbAIC HCG UDS  Psychotherapy:    Medications:    Consultations:    Discharge Concerns:    Estimated LOS:   Other:     Physician Treatment Plan for Primary Diagnosis: <principal problem not specified> Long Term Goal(s): Improvement in symptoms so as ready for discharge  Short Term Goals: Ability to verbalize feelings will improve, Ability to disclose and discuss suicidal ideas, Ability to demonstrate self-control will improve, Ability to identify and develop effective coping behaviors will improve, Ability to maintain clinical measurements within normal limits will improve, Compliance with prescribed medications will improve and Ability to identify triggers associated with substance abuse/mental health issues will improve  Physician Treatment Plan for Secondary Diagnosis: Active Problems:   MDD (major depressive disorder), recurrent severe, without psychosis ([place])  Long Term Goal(s): Improvement in symptoms so as ready for discharge  Short Term Goals: Ability to verbalize feelings will improve, Ability to demonstrate self-control will improve, Ability to identify and develop effective coping behaviors will improve, Ability to maintain clinical measurements within normal limits will improve and Ability to identify triggers associated with substance abuse/mental health issues will improve  I certify that inpatient services furnished  can reasonably be expected to improve the patient's condition.    ARFEEN,SYED T., MD 11/26/201710:11 AM

## 2016-11-14 NOTE — Progress Notes (Signed)
D) Pt has attended the groups and has interacted appropriately with her peers. Affect and mood are appropriate. Pt rates her depression at a 2, hopelessness at a 2 and her anxiety at an 8. Pt denies SI and HI. States she is learning a lot from the groups and is happy that she is here "I didn't want to be here but I am finding out things about myself and that is good".  A) Given support, reassurance and praise. Provided with a brief 1:1. Encouragement given. R) Pt denies SI and HI,
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
## 2016-11-15 NOTE — [facility] Group Notes (Signed)
[facility] LCSW Group Therapy 11/15/2016 1:15 PM Type of Therapy: Group Therapy Participation Level: Active  Participation Quality: Attentive, Sharing and Supportive  Affect: Appropriate  Cognitive: Alert and Oriented  Insight: Developing/Improving and Engaged  Engagement in Therapy: Developing/Improving and Engaged  Modes of Intervention: Clarification, Confrontation, Discussion, Education, Exploration, Limit-setting, Orientation, Problem-solving, Rapport Building, Dance movement psychotherapisteality Testing, Socialization and Support  Summary of Progress/Problems: The topic for today was establishing healthy boundaries in relationships. Pt processed their feelings related to boundary setting and was able to relate to peers. Pt discussed skills that can be used for establishing healthy boundaries and how boundaries relate to emotional wellness. Patient participated minimally in discussion despite CSW encouragement.   Samuella BruinKristin Vern Guerette, MSW, LCSW Clinical Social Worker [facility] [phone]

## 2016-11-15 NOTE — Progress Notes (Signed)
Recreation Therapy Notes  Date: 11/15/16 Time: 0930 Location: [address] Dayroom  Group Topic: Stress Management  Goal Area(s) Addresses:  Patient will verbalize importance of using healthy stress management.  Patient will identify positive emotions associated with healthy stress management.   Behavioral Response: Engaged  Intervention: Guided Imagery  Activity :  Peaceful Waves.  LRT introduced the stress management technique of guided imagery.  LRT read a script to allow patients to engage in the technique.  Patients were to follow along as LRT read script.  Education:  Stress Management, Discharge Planning.   Education Outcome: Acknowledges edcuation/In group clarification offered/Needs additional education  Clinical Observations/Feedback: Pt attended group.    Victoria Sanders, LRT/CTRS          Wendy Mikles A 11/15/2016 12:38 PM 

## 2016-11-15 NOTE — Progress Notes (Signed)
[facility] MD Progress Note  11/15/2016 5:51 PM Mahagony Grieb  MRN:  [id number] Subjective:  Patient reports " I am feeling pretty good today", and her focus is on discharging soon. She denies medication side effects. At this time minimizes depression, reports her mood as "OK" at present . Denies suicidal ideations. Objective: I have discussed case with treatment team and have met with patient . 20 year old female, employed, enrolled in [employer], states she recently felt overwhelmed in the context of argument with boyfriend, and stressors associated with work and school. At the time endorsed vague suicidal ideations, but currently denies any SI or self injurious ideations, and states " I am feeling OK". She is future oriented, and states she is planning on withdrawing from [employer] as she has realized it is not what she wants to do, and focus on working, saving money, and enroll in college at a later date . Regarding events leading to admission, she states she has history of depression but denies any prior suicidal ideations, denies history of self cutting, and has had no prior psychiatric admissions- states "  I just started thinking about breaking up , and then it made me think of what if don't do well in school or at work, and I just felt  overwhelmed "  Denies feeling anxious or overwhelmed at this time, and states " I am OK now". No disruptive or agitated behaviors on unit. Visible in  Day room. Principal Problem: Suicidal ideations Diagnosis:   Patient Active Problem List   Diagnosis Date Noted  . MDD (major depressive disorder), recurrent severe, without psychosis ([place]) [F33.2] 11/13/2016   Total Time spent with patient: 20 minutes    Past Medical History:  Past Medical History:  Diagnosis Date  . Depression    History reviewed. No pertinent surgical history. Family History:  Family History  Problem Relation Age of Onset  . Diabetes Maternal Uncle   . Stroke  Maternal Uncle   . Cancer Paternal Grandmother     BRAIN TUMOR   . Diabetes Paternal Grandmother   . Cancer Paternal Grandfather     LUNG     Social History:  History  Alcohol Use No     History  Drug Use No    Social History   Social History  . Marital status: Single    Spouse name: N/A  . Number of children: N/A  . Years of education: N/A   Social History Main Topics  . Smoking status: Never Smoker  . Smokeless tobacco: Never Used  . Alcohol use No  . Drug use: No  . Sexual activity: Yes    Birth control/ protection: None     Comment: 1ST INTERCOURSE- 54, PARTNERS- 4    Other Topics Concern  . None   Social History Narrative  . None   Additional Social History:   Sleep: Good  Appetite:  Good  Current Medications: Current Facility-Administered Medications  Medication Dose Route Frequency Provider Last Rate Last Dose  . acetaminophen (TYLENOL) tablet 650 mg  650 mg Oral Q6H PRN Ethelene Hal, NP      . alum & mag hydroxide-simeth (MAALOX/MYLANTA) 200-200-20 MG/5ML suspension 30 mL  30 mL Oral Q4H PRN Ethelene Hal, NP      . escitalopram (LEXAPRO) tablet 10 mg  10 mg Oral Daily Kathlee Nations, MD   10 mg at 11/15/16 0826  . hydrOXYzine (ATARAX/VISTARIL) tablet 25 mg  25 mg Oral TID PRN  Ethelene Hal, NP      . magnesium hydroxide (MILK OF MAGNESIA) suspension 30 mL  30 mL Oral Daily PRN Ethelene Hal, NP      . traZODone (DESYREL) tablet 50 mg  50 mg Oral QHS PRN Ethelene Hal, NP        Lab Results: No results found for this or any previous visit (from the past 48 hour(s)).  Blood Alcohol level:  Lab Results  Component Value Date   ETH <5 70/35/0093    Metabolic Disorder Labs: No results found for: HGBA1C, MPG No results found for: PROLACTIN No results found for: CHOL, TRIG, HDL, CHOLHDL, VLDL, LDLCALC  Physical Findings: AIMS: Facial and Oral Movements Muscles of Facial Expression: None, normal Lips and  Perioral Area: None, normal Jaw: None, normal Tongue: None, normal,Extremity Movements Upper (arms, wrists, hands, fingers): None, normal Lower (legs, knees, ankles, toes): None, normal, Trunk Movements Neck, shoulders, hips: None, normal, Overall Severity Severity of abnormal movements (highest score from questions above): None, normal Incapacitation due to abnormal movements: None, normal Patient's awareness of abnormal movements (rate only patient's report): No Awareness, Dental Status Current problems with teeth and/or dentures?: No Does patient usually wear dentures?: No  CIWA:    COWS:     Musculoskeletal: Strength & Muscle Tone: within normal limits Gait & Station: normal Patient leans: N/A  Psychiatric Specialty Exam: Physical Exam  ROS no nausea, no vomiting   Blood pressure 115/65, pulse 96, temperature 99.1 F (37.3 C), temperature source Oral, resp. rate 18, height _0  (1.702 m), weight 161 lb (73 kg), last menstrual period 10/14/2016, SpO2 100 %.Body mass index is 25.22 kg/m.  General Appearance: Well Groomed  Eye Contact:  Good  Speech:  Normal Rate  Volume:  Normal  Mood:  reports improving mood, minimizes depression at this time  Affect:  Appropriate and Full Range  Thought Process:  Linear  Orientation:  Full (Time, Place, and Person)  Thought Content:  denies hallucinations, no delusions  Suicidal Thoughts:  No- at this time denies suicidal ideations, denies any self injurious ideations, denies any homicidal or violent ideations   Homicidal Thoughts:  No  Memory:  recent and remote grossly intact   Judgement:  Other:  improving   Insight:  Fair  Psychomotor Activity:  Normal  Concentration:  Concentration: Good and Attention Span: Good  Recall:  Good  Fund of Knowledge:  Good  Language:  Good  Akathisia:  Negative  Handed:  Right  AIMS (if indicated):     Assets:  Communication Skills Desire for Improvement Resilience  ADL's:  Intact  Cognition:   WNL  Sleep:  Number of Hours: 6.75   Assessment - patient reports improved mood and currently minimizes depression or neuro-vegetative symptoms of depression, denies SI. She is future oriented. Focused on discharging soon. Does endorse history of depression, and reports tendency to worry and feel overwhelmed at times.Tolerating medications well at this time  Treatment Plan Summary: Daily contact with patient to assess and evaluate symptoms and progress in treatment, Medication management, Plan inpatient admission and medications as below Encourage group and milieu participation to work on coping skills and symptom reduction Continue Lexapro 10 mgrs QDAY for depression, anxiety- side effects discussed, including risk of increased suicidal ideations early in treatment with antidepressants in young adults  Continue Vistaril 25 mgrs TID PRN for anxiety as needed  Continue Trazodone 50 mgrs QHS PRN for insomnia as needed  Treatment team working on disposition  planning as needed  Neita Garnet, MD 11/15/2016, 5:51 PM

## 2016-11-15 NOTE — B[facility] Group Notes (Signed)
Pt attended group. The subject was on wellness. The question was asked What is the one thing that would make your life better? Pt. Stated not letting my past affect my future.

## 2016-11-15 NOTE — Plan of Care (Signed)
Problem: Coping: Goal: Ability to demonstrate self-control will improve Outcome: Progressing Patient's behavior has been appropriate on the unit.

## 2016-11-15 NOTE — Progress Notes (Signed)
D: Patient is visible in the milieu; she is interacting well with her peers.  She rates her depression as a 1; denies hopelessness; rates anxiety as a 3.  Patient reports good sleep and appetite; her concentration is good; energy is normal.  Her goal today is "to figure out my plan."  She denies any thoughts of self harm.  Patient states her depressive symptoms have improved.  She is attending groups and participating.  A: Continue to monitor medication management and MD orders.  Safety checks completed every 15 minutes per protocol.  Offer support and encouragement as needed. R: Patient is receptive to staff; her behavior is appropriate.

## 2016-11-15 NOTE — B[facility] Group Notes (Signed)
Patient attend group. Her goal was a 8. Her goal was to handle her med's. sje was able to accomplish her goal.

## 2016-11-15 NOTE — Tx Team (Signed)
Interdisciplinary Treatment and Diagnostic Plan Update  11/15/2016 Time of Session: 9:30am Victoria DoffingCarlie Ann Sanders MRN: [id number]  Principal Diagnosis: Active Problems:   MDD (major depressive disorder), recurrent severe, without psychosis (HCC)   Current Medications:  Current Facility-Administered Medications  Medication Dose Route Frequency Provider Last Rate Last Dose  . acetaminophen (TYLENOL) tablet 650 mg  650 mg Oral Q6H PRN Laveda AbbeLaurie Britton Parks, NP      . alum & mag hydroxide-simeth (MAALOX/MYLANTA) 200-200-20 MG/5ML suspension 30 mL  30 mL Oral Q4H PRN Laveda AbbeLaurie Britton Parks, NP      . escitalopram (LEXAPRO) tablet 10 mg  10 mg Oral Daily Cleotis NipperSyed T Arfeen, MD   10 mg at 11/15/16 [id number]  . hydrOXYzine (ATARAX/VISTARIL) tablet 25 mg  25 mg Oral TID PRN Laveda AbbeLaurie Britton Parks, NP      . magnesium hydroxide (MILK OF MAGNESIA) suspension 30 mL  30 mL Oral Daily PRN Laveda AbbeLaurie Britton Parks, NP      . traZODone (DESYREL) tablet 50 mg  50 mg Oral QHS PRN Laveda AbbeLaurie Britton Parks, NP       PTA Medications: Prescriptions Prior to Admission  Medication Sig Dispense Refill Last Dose  . fluconazole (DIFLUCAN) 150 MG tablet Take 1 tablet (150 mg total) by mouth once. (Patient not taking: Reported on 11/13/2016) 1 tablet 1 Completed Course at Unknown time    Patient Stressors: Educational concerns Marital or family conflict Occupational concerns  Patient Strengths: Ability for insight Average or above average intelligence Communication skills General fund of knowledge Physical Health  Treatment Modalities: Medication Management, Group therapy, Case management,  1 to 1 session with clinician, Psychoeducation, Recreational therapy.   Physician Treatment Plan for Primary Diagnosis: MDD Long Term Goal(s): Improvement in symptoms so as ready for discharge Improvement in symptoms so as ready for discharge   Short Term Goals: Ability to verbalize feelings will improve Ability to disclose and discuss  suicidal ideas Ability to demonstrate self-control will improve Ability to identify and develop effective coping behaviors will improve Ability to maintain clinical measurements within normal limits will improve Compliance with prescribed medications will improve Ability to identify triggers associated with substance abuse/mental health issues will improve Ability to verbalize feelings will improve Ability to demonstrate self-control will improve Ability to identify and develop effective coping behaviors will improve Ability to maintain clinical measurements within normal limits will improve Ability to identify triggers associated with substance abuse/mental health issues will improve  Medication Management: Evaluate patient's response, side effects, and tolerance of medication regimen.  Therapeutic Interventions: 1 to 1 sessions, Unit Group sessions and Medication administration.  Evaluation of Outcomes: Adequate for Discharge    RN Treatment Plan for Primary Diagnosis: MDD Long Term Goal(s): Knowledge of disease and therapeutic regimen to maintain health will improve  Short Term Goals: Ability to remain free from injury will improve, Ability to disclose and discuss suicidal ideas, Ability to identify and develop effective coping behaviors will improve and Compliance with prescribed medications will improve  Medication Management: RN will administer medications as ordered by provider, will assess and evaluate patient's response and provide education to patient for prescribed medication. RN will report any adverse and/or side effects to prescribing provider.  Therapeutic Interventions: 1 on 1 counseling sessions, Psychoeducation, Medication administration, Evaluate responses to treatment, Monitor vital signs and CBGs as ordered, Perform/monitor CIWA, COWS, AIMS and Fall Risk screenings as ordered, Perform wound care treatments as ordered.  Evaluation of Outcomes: Adequate for  Discharge   LCSW Treatment Plan for Primary  Diagnosis: MDD Long Term Goal(s): Safe transition to appropriate next level of care at discharge, Engage patient in therapeutic group addressing interpersonal concerns.  Short Term Goals: Engage patient in aftercare planning with referrals and resources, Increase social support, Increase emotional regulation, Identify triggers associated with mental health/substance abuse issues and Increase skills for wellness and recovery  Therapeutic Interventions: Assess for all discharge needs, 1 to 1 time with Social worker, Explore available resources and support systems, Assess for adequacy in community support network, Educate family and significant other(s) on suicide prevention, Complete Psychosocial Assessment, Interpersonal group therapy.  Evaluation of Outcomes: Adequate for Discharge   Progress in Treatment :  Attending groups: Yes  Participating in groups: Yes  Taking medication as prescribed: Yes, MD continuing to assess for appropriate medication regimen  Toleration medication: Yes  Family/Significant other contact made: No, CSW assessing for appropriate contacts  Patient understands diagnosis: Yes  Discussing patient identified problems/goals with staff: Yes  Medical problems stabilized or resolved: Yes  Denies suicidal/homicidal ideation: Yes, denies  Issues/concerns per patient self-inventory: None reported  Other: N/A  New problem(s) identified: None reported at this time    New Short Term/Long Term Goal(s): None at this time    Discharge Plan or Barriers: Patient plans to return home to follow up with [facility].    Reason for Continuation of Hospitalization: Anxiety Depression Medication stabilization Withdrawal symptoms  Estimated Length of Stay: 2-3 days    Attendees:  Patient:   Physician: Dr. Jama Flavorsobos, Dr. Mckinley Jewelates, MD  11/15/2016   9:30am  Nursing: Quintella ReichertBeverly Knight, Jolly Mangoheresa, Caroline Beaudry, Christa Dopson,  RN 11/15/2016 9:30am  RN Care Manager: Onnie BoerJennifer Clark, CM  11/15/2016 9:30am  Social Workers: Chad CordialLauren Carter, LCSW, Samuella BruinKristin Thelda Gagan, LCSW, [place] Smart, LCSW   11/15/2016 9:30am  Nurse Pratictioners: Janey GreaserAggie Nwoko, Tanika Lewis, NP 11/15/2016 9:30am  Other:      Scribe for Treatment Team: Samuella BruinKristin Kirsten Mckone, LCSW Clinical Social Worker [facility] [phone]

## 2016-11-16 DIAGNOSIS — R45851 Suicidal ideations: Secondary | ICD-10-CM

## 2016-11-16 MED ORDER — ESCITALOPRAM OXALATE 10 MG PO TABS: 10.0000 mg | ORAL_TABLET | Freq: Every day | ORAL | 0 refills | Status: DC

## 2016-11-16 MED ORDER — TRAZODONE HCL 50 MG PO TABS: 50.0000 mg | ORAL_TABLET | Freq: Every evening | ORAL | 0 refills | Status: DC | PRN

## 2016-11-16 MED ORDER — HYDROXYZINE HCL 25 MG PO TABS: 25.0000 mg | ORAL_TABLET | Freq: Three times a day (TID) | ORAL | 0 refills | Status: DC | PRN

## 2016-11-16 NOTE — B[facility] Group Notes (Signed)
Pt attended spiritual care group on grief and loss facilitated by chaplain Cherilyn Sautter   Group opened with brief discussion and psycho-social ed around grief and loss in relationships and in relation to self - identifying life patterns, circumstances, changes that cause losses. Established group norm of speaking from own life experience. Group goal of establishing open and affirming space for members to share loss and experience with grief, normalize grief experience and provide psycho social education and grief support.     

## 2016-11-16 NOTE — B[facility] Suicide Risk Assessment (Signed)
[facility] INPATIENT:  Family/Significant Other Suicide Prevention Education  Suicide Prevention Education:  Education Completed; mother Wendie ChessMelanie Benton [phone],  (name of family member/significant other) has been identified by the patient as the family member/significant other with whom the patient will be residing, and identified as the person(s) who will aid the patient in the event of a mental health crisis (suicidal ideations/suicide attempt).  With written consent from the patient, the family member/significant other has been provided the following suicide prevention education, prior to the and/or following the discharge of the patient.  The suicide prevention education provided includes the following:  Suicide risk factors  Suicide prevention and interventions  National Suicide Hotline telephone number  [facility] assessment telephone number  [facility] Emergency Assistance 911  [facility] and/or Residential [facility] Crisis Unit telephone number  Request made of family/significant other to:  Remove weapons (e.g., guns, rifles, knives), all items previously/currently identified as safety concern.    Remove drugs/medications (over-the-counter, prescriptions, illicit drugs), all items previously/currently identified as a safety concern.  The family member/significant other verbalizes understanding of the suicide prevention education information provided.  The family member/significant other agrees to remove the items of safety concern listed above.  Dameshia Seybold L Clinten Howk 11/16/2016, 10:27 AM

## 2016-11-16 NOTE — [facility] Suicide Risk Assessment (Signed)
[facility]   Principal Problem:   Depression, suicidal ideations Discharge Diagnoses:  Patient Active Problem List   Diagnosis Date Noted  . MDD (major depressive disorder), recurrent severe, without psychosis (HCC) [F33.2] 11/13/2016    Total Time spent with patient: 30 minutes  Musculoskeletal: Strength & Muscle Tone: within normal limits Gait & Station: normal Patient leans: N/A  Psychiatric Specialty Exam: ROS mild headache, no visual disturbances, no nausea, no vomiting.   Blood pressure 122/69, pulse 98, temperature 98.2 F (36.8 C), resp. rate 16, height 5\' 7"  (1.702 m), weight 161 lb (73 kg), last menstrual period 10/14/2016, SpO2 100 %.Body mass index is 25.22 kg/m.  General Appearance: Well Groomed  Eye Contact::  Good  Speech:  Normal Rate409  Volume:  Normal  Mood:  improved mood and at this time denies depression  Affect:  Appropriate and reactive   Thought Process:  Linear  Orientation:  Full (Time, Place, and Person)  Thought Content:  denies hallucinations, no delusions , not internally preoccupied   Suicidal Thoughts:  No denies suicidal ideations, denies any self injurious ideations, denies any violent or homicidal ideations   Homicidal Thoughts:  No  Memory:  recent and remote grossly intact   Judgement:  Other:  improving   Insight:  improving   Psychomotor Activity:  Normal  Concentration:  Good  Recall:  Good  Fund of Knowledge:Good  Language: Good  Akathisia:  Negative  Handed:  Right  AIMS (if indicated):     Assets:  Communication Skills Desire for Improvement Resilience  Sleep:  Number of Hours: 6.75  Cognition: WNL  ADL's:  Intact   Mental Status Per Nursing Assessment::   On Admission:  Suicidal ideation indicated by patient, Self-harm thoughts  Demographic Factors:  20 year old single female   Loss Factors: Academic and work related stressors, recent break up with BF   Historical Factors: No prior  psychiatric admissions, no prior history of suicidal ideations or self injurious behaviors   Risk Reduction Factors:   Sense of responsibility to family, Employed, Positive social support and Positive coping skills or problem solving skills  Continued Clinical Symptoms:  At this time patient is alert, attentive, well related, pleasant, mood is improved, denies depression at this time, affect is reactive, brighter, no thought disorder,no suicidal ideations , no self injurious ideations, no homicidal ideations, no psychotic symptoms, future oriented. Behavior on unit calm and in good control Denies medication side effects- we have reviewed side effects, to include possible risk of increased suicidal ideations early in treatment with antidepressants in young adults   Cognitive Features That Contribute To Risk:  No gross cognitive deficits noted upon discharge. Is alert , attentive, and oriented x 3   Suicide Risk:  Mild:  Suicidal ideation of limited frequency, intensity, duration, and specificity.  There are no identifiable plans, no associated intent, mild dysphoria and related symptoms, good self-control (both objective and subjective assessment), few other risk factors, and identifiable protective factors, including available and accessible social support.  Follow-up Information    [facility] Follow up.   Specialty:  Behavioral Health Why:  Please go to walk-in clinic within 7 days of discharge Monday-Friday at 8am for assessment for therapy and medication management services.  Contact information: [address] [place] [address] [phone]           Plan Of Care/Follow-up recommendations:  Activity:  as tolerated  Diet:  Regular Tests:  NA Other:  See below  Patient is leaving unit in good spirits  Plans to return home  Follow up as above   Nehemiah MassedOBOS, FERNANDO, MD 11/16/2016, 11:40 AM

## 2016-11-16 NOTE — Progress Notes (Signed)
Discharge note:  Patient discharged home per MD order.  Patient received all personal belongings from unit and locker.  Patient denies any thoughts of self harm.  Reviewed AVS/transition record with patient and she indicated understanding.  Patient received prescriptions and medication samples.  Patient left ambulatory with her mother.

## 2016-11-16 NOTE — Progress Notes (Signed)
D: Patient is focused on discharge.  She denies any depressive symptoms.  She rates her depression as a 3.  Patient reports her mood is stable.  She reports she is sleeping and eating well.  She continues to interact well with her peers.  She is attending groups and participating.  Patient denies any thoughts of self harm. A: Continue to monitor medication management and MD orders.  Safety checks completed every 15 minutes per protocol.  Offer support and encouragement as needed. R: Patient is receptive to staff; her behavior is appropriate.    Patient is being discharged today; discharge paperwork initiated.

## 2016-11-16 NOTE — Discharge Summary (Signed)
Physician Discharge Summary Note  Patient:  Victoria Sanders is an 20 y.o., female  MRN:  [id number]  DOB:  04-Sep-1996  Patient phone:  [phone] (home)   Patient address:   [address] [place] [address],   Total Time spent with patient: Greater than 30 minutes  Date of Admission:  11/13/2016  Date of Discharge: 11-16-16  Reason for Admission: Suicide attempt by overdose.  Principal Problem: Major depressive disorder, recurrent episode, severe.  Discharge Diagnoses: Patient Active Problem List   Diagnosis Date Noted  . MDD (major depressive disorder), recurrent severe, without psychosis (HCC) [F33.2] 11/13/2016   Past Psychiatric History: Major depressive disorder, recurrent.  Past Medical History:  Past Medical History:  Diagnosis Date  . Depression    History reviewed. No pertinent surgical history.  Family History:  Family History  Problem Relation Age of Onset  . Diabetes Maternal Uncle   . Stroke Maternal Uncle   . Cancer Paternal Grandmother     BRAIN TUMOR   . Diabetes Paternal Grandmother   . Cancer Paternal Grandfather     LUNG    Family Psychiatric  History: See H&P  Social History:  History  Alcohol Use No     History  Drug Use No    Social History   Social History  . Marital status: Single    Spouse name: N/A  . Number of children: N/A  . Years of education: N/A   Social History Main Topics  . Smoking status: Never Smoker  . Smokeless tobacco: Never Used  . Alcohol use No  . Drug use: No  . Sexual activity: Yes    Birth control/ protection: None     Comment: 1ST INTERCOURSE- 6116, PARTNERS- 4    Other Topics Concern  . None   Social History Narrative  . None   Hospital Course: Patient is a 20 year old female who is brought in by her mother as patient was experiencing severe depression and having suicidal thoughts.  She admitted that she has symptoms of depression for past few months but lately it is getting worse and  intense.  She is feeling hopeless, worthless, isolated, withdrawn and decreased energy.  She has multiple stressors including overwhelming school, stressful job and recently family issues.   [name] was admitted to the hospital for worsening symptoms of depression & crisis management due to suicidal thoughts. She stated on the day of her admission that this has been going on x several months. She was in need of mood stabilization treatments. After her admission assessment, her presenting symptoms were identified. The medication regimen targeting those symptoms were initiated with her consent. She was medicated & discharged on; Fluoxetine 20 mg for depression,  Hydroxyzine 25 mg prn for anxiety & Trazodone 50 mg for insomnia. Her other pre-existing medical problems were identified & monitored as needed. [name] was enrolled & participated in the group counseling sessions being offered & held on this unit. She learned coping skills that should help her cope better after discharge.  During the course of her hospitalization, [name]'s improvement was monitored by observation & her daily report of symptom reduction noted.  Her emotional & mental status were monitored by the daily self-inventory reports completed by her & the clinical staff. She was evaluated by the treatment team for mood stability daily and plans for continued recovery after discharge. [name]'s motivation was an integral factor in her mood stability. She was offered further treatment options upon discharge on an  outpatient basis to maintain mood stability.   Upon discharge, [name] was both mentally and medically stable. She denies suicidal/homicidal ideations, auditory/visual/tactile hallucinations, delusional thoughts & or paranoia. She received from the pharmacy, a 7 days worth, supply samples of [facility]. [facility] belongings in no distress. Transportation per boyfriend .  Physical  Findings: AIMS: Facial and Oral Movements Muscles of Facial Expression: None, normal Lips and Perioral Area: None, normal Jaw: None, normal Tongue: None, normal,Extremity Movements Upper (arms, wrists, hands, fingers): None, normal Lower (legs, knees, ankles, toes): None, normal, Trunk Movements Neck, shoulders, hips: None, normal, Overall Severity Severity of abnormal movements (highest score from questions above): None, normal Incapacitation due to abnormal movements: None, normal Patient's awareness of abnormal movements (rate only patient's report): No Awareness, Dental Status Current problems with teeth and/or dentures?: No Does patient usually wear dentures?: No  CIWA:    COWS:     Musculoskeletal: Strength & Muscle Tone: within normal limits Gait & Station: normal Patient leans: Right  Psychiatric Specialty Exam: Physical Exam  Constitutional: She appears well-developed and well-nourished.  HENT:  Head: Normocephalic.  Eyes: Pupils are equal, round, and reactive to light.  Neck: Normal range of motion.  Cardiovascular: Normal rate.   Respiratory: Effort normal.  GI: Soft.  Genitourinary:  Genitourinary Comments: Denies any issues in this area  Musculoskeletal: Normal range of motion.  Neurological: She is alert.  Skin: Skin is warm.    Review of Systems  Constitutional: Negative.   HENT: Negative.   Eyes: Negative.   Respiratory: Negative.   Cardiovascular: Negative.   Gastrointestinal: Negative.   Musculoskeletal: Negative.   Skin: Negative.     Blood pressure 122/69, pulse 98, temperature 98.2 F (36.8 C), resp. rate 16, height 5\' 7"  (1.702 m), weight 73 kg (161 lb), last menstrual period 10/14/2016, SpO2 100 %.Body mass index is 25.22 kg/m.  See Md's SRA   Have you used any form of tobacco in the last 30 days? (Cigarettes, Smokeless Tobacco, Cigars, and/or Pipes): No  Has this patient used any form of tobacco in the last 30 days? (Cigarettes, Smokeless  Tobacco, Cigars, and/or Pipes): No  Blood Alcohol level:  Lab Results  Component Value Date   ETH <5 11/13/2016   Metabolic Disorder Labs:  No results found for: HGBA1C, MPG No results found for: PROLACTIN No results found for: CHOL, TRIG, HDL, CHOLHDL, VLDL, LDLCALC  See Psychiatric Specialty Exam and Suicide Risk Assessment completed by Attending Physician prior to discharge.  Discharge destination:  Home  Is patient on multiple antipsychotic therapies at discharge:  No   Has Patient had three or more failed trials of antipsychotic monotherapy by history:  No  Recommended Plan for Multiple Antipsychotic Therapies: NA    Medication List    STOP taking these medications   fluconazole 150 MG tablet Commonly known as:  DIFLUCAN     TAKE these medications     Indication  escitalopram 10 MG tablet Commonly known as:  LEXAPRO Take 1 tablet (10 mg total) by mouth daily. For depression Start taking on:  11/17/2016  Indication:  Major Depressive Disorder   hydrOXYzine 25 MG tablet Commonly known as:  ATARAX/VISTARIL Take 1 tablet (25 mg total) by mouth 3 (three) times daily as needed for anxiety.  Indication:  Anxiety Neurosis   traZODone 50 MG tablet Commonly known as:  DESYREL Take 1 tablet (50 mg total) by mouth at bedtime as needed for sleep.  Indication:  Trouble Sleeping      Follow-up Information    [facility] Follow up.   Specialty:  Behavioral Health Why:  Please go to walk-in clinic within 7 days of discharge Monday-Friday at 8am for assessment for therapy and medication management services.  [address] [place] [address] [phone]          Follow-up recommendations: Activity:  As tolerated Diet: As recommended by your primary care doctor. Keep all scheduled follow-up appointments as recommended.  Comments: Patient is instructed prior to discharge to: Take all medications as prescribed by his/her mental healthcare  provider. Report any adverse effects and or reactions from the medicines to his/her outpatient provider promptly. Patient has been instructed & cautioned: To not engage in alcohol and or illegal drug use while on prescription medicines. In the event of worsening symptoms, patient is instructed to call the crisis hotline, 911 and or go to the nearest ED for appropriate evaluation and treatment of symptoms. To follow-up with his/her primary care provider for your other medical issues, concerns and or health care needs.   Signed: Sanjuana Kava, NP, PMHNP, FNP-BC 11/16/2016, 10:16 AM   Patient seen, Suicide Assessment Completed.  Disposition Plan Reviewed

## 2016-11-16 NOTE — Progress Notes (Signed)
D    Pt is pleasant on approach and cooperative   She interacts well with other patients and denies suicidal ideation    She is compliant with treatment A   Verbal support given   Medications offered    Q 15 min checks R   Pt is safe at present

## 2016-11-16 NOTE — Progress Notes (Signed)
  [facility] Discharge Plan :  Will you be returning to the same living situation after discharge:  Yes,  patient plans to return home At discharge, do you have transportation home?: Yes,  family to pick up Do you have the ability to pay for your medications: Yes,  patient will be provided with prescriptions at discharge  Release of information consent forms completed and in the chart;  Patient's signature needed at discharge.  Patient to Follow up at: Follow-up Information    [facility] Follow up.   Specialty:  Behavioral Health Why:  Please go to walk-in clinic within 7 days of discharge Monday-Friday at 8am for assessment for therapy and medication management services.  Contact information: [address] [place] [address] [phone]           Next level of care provider has access to [facility] Link:no  Safety Planning and Suicide Prevention discussed: Yes,  with patient and mother  Have you used any form of tobacco in the last 30 days? (Cigarettes, Smokeless Tobacco, Cigars, and/or Pipes): No  Has patient been referred to the Quitline?: N/A patient is not a smoker  Patient has been referred for addiction treatment: Yes  Arnesia Vincelette L Alexzavier Girardin 11/16/2016, 10:28 AM

## 2017-05-04 ENCOUNTER — Encounter: Payer: Self-pay | Admitting: Gynecology

## 2017-05-05 ENCOUNTER — Other Ambulatory Visit (HOSPITAL_COMMUNITY): Payer: Self-pay | Admitting: Obstetrics

## 2017-05-05 DIAGNOSIS — Z3689 Encounter for other specified antenatal screening: Secondary | ICD-10-CM

## 2017-05-26 ENCOUNTER — Encounter (HOSPITAL_COMMUNITY): Payer: Self-pay | Admitting: *Deleted

## 2017-05-27 ENCOUNTER — Encounter (HOSPITAL_COMMUNITY): Payer: Self-pay

## 2017-05-27 ENCOUNTER — Ambulatory Visit (HOSPITAL_COMMUNITY)
Admission: RE | Admit: 2017-05-27 | Discharge: 2017-05-27 | Disposition: A | Discharge status: Home / Self Care | Payer: Managed Care, Other (non HMO) | Source: Ambulatory Visit | Attending: Obstetrics | Admitting: Obstetrics

## 2017-05-27 DIAGNOSIS — Z3689 Encounter for other specified antenatal screening: Secondary | ICD-10-CM | POA: Insufficient documentation

## 2017-05-27 DIAGNOSIS — Z3A23 23 weeks gestation of pregnancy: Secondary | ICD-10-CM | POA: Diagnosis not present

## 2017-05-27 DIAGNOSIS — O359XX0 Maternal care for (suspected) fetal abnormality and damage, unspecified, not applicable or unspecified: Secondary | ICD-10-CM | POA: Diagnosis not present

## 2017-06-23 ENCOUNTER — Ambulatory Visit (HOSPITAL_COMMUNITY): Payer: Self-pay | Admitting: Psychiatry

## 2017-07-03 ENCOUNTER — Emergency Department (HOSPITAL_COMMUNITY)
Admission: EM | Admit: 2017-07-03 | Discharge: 2017-07-03 | Disposition: A | Discharge status: Home / Self Care | Payer: Managed Care, Other (non HMO) | Attending: Emergency Medicine | Admitting: Emergency Medicine

## 2017-07-03 ENCOUNTER — Encounter (HOSPITAL_COMMUNITY): Payer: Self-pay | Admitting: *Deleted

## 2017-07-03 ENCOUNTER — Emergency Department (HOSPITAL_COMMUNITY): Payer: Managed Care, Other (non HMO)

## 2017-07-03 DIAGNOSIS — R1011 Right upper quadrant pain: Secondary | ICD-10-CM | POA: Diagnosis not present

## 2017-07-03 DIAGNOSIS — O209 Hemorrhage in early pregnancy, unspecified: Secondary | ICD-10-CM | POA: Insufficient documentation

## 2017-07-03 DIAGNOSIS — Z3A27 27 weeks gestation of pregnancy: Secondary | ICD-10-CM | POA: Diagnosis not present

## 2017-07-03 DIAGNOSIS — Z79899 Other long term (current) drug therapy: Secondary | ICD-10-CM | POA: Diagnosis not present

## 2017-07-03 DIAGNOSIS — R1010 Upper abdominal pain, unspecified: Secondary | ICD-10-CM

## 2017-07-03 LAB — URINALYSIS, ROUTINE W REFLEX MICROSCOPIC
Bilirubin Urine: NEGATIVE
Glucose, UA: NEGATIVE mg/dL
HGB URINE DIPSTICK: NEGATIVE
Ketones, ur: NEGATIVE mg/dL
NITRITE: NEGATIVE
PROTEIN: NEGATIVE mg/dL
SPECIFIC GRAVITY, URINE: 1.017 (ref 1.005–1.030)
pH: 6 (ref 5.0–8.0)

## 2017-07-03 LAB — COMPREHENSIVE METABOLIC PANEL
ALBUMIN: 3.2 g/dL — AB (ref 3.5–5.0)
ALK PHOS: 65 U/L (ref 38–126)
ALT: 22 U/L (ref 14–54)
AST: 18 U/L (ref 15–41)
Anion gap: 8 (ref 5–15)
BILIRUBIN TOTAL: 0.2 mg/dL — AB (ref 0.3–1.2)
BUN: 6 mg/dL (ref 6–20)
CALCIUM: 8.8 mg/dL — AB (ref 8.9–10.3)
CO2: 21 mmol/L — AB (ref 22–32)
Chloride: 106 mmol/L (ref 101–111)
Creatinine, Ser: 0.5 mg/dL (ref 0.44–1.00)
GFR calc Af Amer: >60 mL/min (ref >60)
GFR calc non Af Amer: >60 mL/min (ref >60)
GLUCOSE: 78 mg/dL (ref 65–99)
Potassium: 3.8 mmol/L (ref 3.5–5.1)
SODIUM: 135 mmol/L (ref 135–145)
TOTAL PROTEIN: 6.2 g/dL — AB (ref 6.5–8.1)

## 2017-07-03 LAB — CBC
HCT: 35.2 % — ABNORMAL LOW (ref 36.0–46.0)
Hemoglobin: 11.6 g/dL — ABNORMAL LOW (ref 12.0–15.0)
MCH: 30.4 pg (ref 26.0–34.0)
MCHC: 33 g/dL (ref 30.0–36.0)
MCV: 92.1 fL (ref 78.0–100.0)
Platelets: 226 10*3/uL (ref 150–400)
RBC: 3.82 MIL/uL — ABNORMAL LOW (ref 3.87–5.11)
RDW: 12.2 % (ref 11.5–15.5)
WBC: 9.2 10*3/uL (ref 4.0–10.5)

## 2017-07-03 LAB — LIPASE, BLOOD: Lipase: 19 U/L (ref 11–51)

## 2017-07-03 LAB — HCG, QUANTITATIVE, PREGNANCY: HCG, BETA CHAIN, QUANT, S: 29325 m[IU]/mL — AB (ref <5)

## 2017-07-03 MED ORDER — SODIUM CHLORIDE 0.9 % IV BOLUS (SEPSIS)
1000.0000 mL | Freq: Once | INTRAVENOUS | Status: AC
  Administered 2017-07-03: 1000 mL via INTRAVENOUS

## 2017-07-03 MED ORDER — ONDANSETRON HCL 4 MG/2ML IJ SOLN
4.0000 mg | Freq: Once | INTRAMUSCULAR | Status: AC
  Administered 2017-07-03: 4 mg via INTRAVENOUS
  Filled 2017-07-03: qty 2

## 2017-07-03 NOTE — ED Notes (Signed)
OB rapid response at bedside 

## 2017-07-03 NOTE — Progress Notes (Signed)
Tct: Dr. Dareen PianoAnderson, updated on patient. Advised of Vaginal exam, FHR and no contractions noted. Patient OB cleared.

## 2017-07-03 NOTE — Progress Notes (Signed)
[facility] patient G1P0, 4045w5d gestation who presented to ER with Lower abd pain/pelvic pain, and RUQ pain. Denies LOF, no vaginal bleeding presently.  States she noticed some Vaginal bleeding 2 days ago, small amount, light pink in color. EFM applied and assessing.

## 2017-07-03 NOTE — ED Triage Notes (Signed)
Pt c/o lower abd pain two days ago with bleeding, pain subsided. Had another episode tonight with severe lower abd pain and decreased fetal movement. Pt is [redacted] weeks pregnant

## 2017-07-03 NOTE — ED Notes (Signed)
Patient transported to [facility] 

## 2017-07-03 NOTE — ED Notes (Signed)
Pt returned from [facility]

## 2017-07-04 LAB — URINE CULTURE

## 2017-07-04 NOTE — ED Provider Notes (Signed)
[facility] Note   CSN: [id number] Arrival date & time: 07/03/17  0258     History   Chief Complaint Chief Complaint  Patient presents with  . Abdominal Pain    HPI Victoria Sanders is a 21 y.o. female.  HPI Patient is a 21 year old G1 P0 who is currently [redacted] weeks pregnant who presents the emergency department complaining of scant vaginal bleeding 2 days ago with some left lower quadrant pain which is now since resolved.  She presents the emergency department tonight describing upper abdominal discomfort and decreased fetal movement.  Part of my evaluation the rapid response OB nurse has been at the bedside and reports normal fetal heart rates in the 150s with no signs to suggest uterine contractions on tocometry.  Patient denies urinary frequency or dysuria.  No fevers or chills.  Denies nausea vomiting.  No prior history of gallstones.  She reports mild discomfort in her upper abdomen.  She has been able to eat today.  Thus far she has had a normal pregnancy without complicating factors.   Past Medical History:  Diagnosis Date  . Depression     Patient Active Problem List   Diagnosis Date Noted  . Suicidal ideations   . 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 NO PAST SURGERIES      OB History    Gravida Para Term Preterm AB Living   1 0 0 0 0 0   SAB TAB Ectopic Multiple Live Births   0 0 0 0         Home Medications    Prior to Admission medications   Medication Sig Start Date End Date Taking? Authorizing Provider  busPIRone (BUSPAR) 10 MG tablet Take 10 mg by mouth 2 (two) times daily. 06/09/17  Yes [provider]  propranolol (INDERAL) 10 MG tablet Take 10 mg by mouth as needed (for anxiety).    Yes [provider]  sertraline (ZOLOFT) 50 MG tablet Take 75 mg by mouth daily.   Yes [provider]    Family  History Family History  Problem Relation Age of Onset  . Diabetes Maternal Uncle   . Stroke Maternal Uncle   . Cancer Paternal Grandmother        BRAIN TUMOR   . Diabetes Paternal Grandmother   . Cancer Paternal Grandfather        LUNG     Social History Social History  Substance Use Topics  . Smoking status: Never Smoker  . Smokeless tobacco: Never Used  . Alcohol use No     Allergies   Patient has no known allergies.   Review of Systems Review of Systems  All other systems reviewed and are negative.    Physical Exam Updated Vital Signs BP (!) 103/55   Pulse 82   Temp 98.3 F (36.8 C) (Oral)   Resp (!) 21   LMP 12/21/2016   SpO2 99%   Physical Exam  Constitutional: She is oriented to person, place, and time. She appears well-developed and well-nourished. No distress.  HENT:  Head: Normocephalic and atraumatic.  Eyes: EOM are normal.  Neck: Normal range of motion.  Cardiovascular: Normal rate, regular rhythm and normal heart sounds.   Pulmonary/Chest: Effort normal and breath sounds normal.  Abdominal: Soft. She exhibits no distension. There is no tenderness.  Gravid uterus consistent with dates  Musculoskeletal: Normal range of  motion.  Neurological: She is alert and oriented to person, place, and time.  Skin: Skin is warm and dry.  Psychiatric: She has a normal mood and affect. Judgment normal.  Nursing note and vitals reviewed.    ED Treatments / Results  Labs (all labs ordered are listed, but only abnormal results are displayed) Labs Reviewed  COMPREHENSIVE METABOLIC PANEL - Abnormal; Notable for the following:       Result Value   CO2 21 (*)    Calcium 8.8 (*)    Total Protein 6.2 (*)    Albumin 3.2 (*)    Total Bilirubin 0.2 (*)    All other components within normal limits  CBC - Abnormal; Notable for the following:    RBC 3.82 (*)    Hemoglobin 11.6 (*)    HCT 35.2 (*)    All other components within normal limits  URINALYSIS, ROUTINE  W REFLEX MICROSCOPIC - Abnormal; Notable for the following:    APPearance HAZY (*)    Leukocytes, UA LARGE (*)    Bacteria, UA RARE (*)    Squamous Epithelial / LPF 6-30 (*)    All other components within normal limits  HCG, QUANTITATIVE, PREGNANCY - Abnormal; Notable for the following:    hCG, Beta Chain, Quant, S 29,325 (*)    All other components within normal limits  URINE CULTURE  LIPASE, BLOOD    EKG  EKG Interpretation None       Radiology [place] Abdomen Limited Ruq  Result Date: 07/03/2017 CLINICAL DATA:  21 year old female with right upper quadrant abdominal pain. EXAM: ULTRASOUND ABDOMEN LIMITED RIGHT UPPER QUADRANT COMPARISON:  None. FINDINGS: Gallbladder: No gallstones or wall thickening visualized. No sonographic Murphy sign noted by sonographer. Common bile duct: Diameter: 2 mm Liver: No focal lesion identified. Within normal limits in parenchymal echogenicity. IMPRESSION: Unremarkable right upper quadrant ultrasound. Electronically Signed   By: Elgie CollardArash  Radparvar M.D.   On: 07/03/2017 06:58    Procedures Procedures (including critical care time)  Medications Ordered in ED Medications  ondansetron [facility](ZOFRAN) injection 4 mg (4 mg Intravenous Given 07/03/17 0623)  sodium chloride 0.9 % bolus 1,000 mL (0 mLs Intravenous Stopped 07/03/17 0739)     Initial Impression / Assessment and Plan / ED Course  I have reviewed the triage vital signs and the nursing notes.  Pertinent labs & imaging results that were available during my care of the patient were reviewed by me and considered in my medical decision making (see chart for details).     Patient without urinary symptoms.  Suspect contaminant.  Fetus appears to be doing well.  Rapid response OB discussed the case with on-call obstetrician for the patient's group who has cleared her from an OB standpoint.  No contractions.  Normal fetal heart rates.  Right upper quadrant ultrasound demonstrates no gallstones.  Patient feels  better at this time.  Discharge home with OB and primary care follow-up.  She understands to return to the ER for new or worsening symptoms.  No clear etiology found for the patient's discomfort and pain at this time although it is very minimal and I feels that there is no life-threatening components to this. LFTs normal.  Urine culture sent.  Patient understands return to the ER for new or worsening symptoms  Final Clinical Impressions(s) / ED Diagnoses   Final diagnoses:  Upper abdominal pain    New Prescriptions Discharge Medication List as of 07/03/2017  7:40 AM       [place],  Caryn Bee, MD 07/04/17 1096

## 2017-07-06 ENCOUNTER — Other Ambulatory Visit (HOSPITAL_COMMUNITY): Payer: Self-pay

## 2017-07-06 ENCOUNTER — Encounter (HOSPITAL_COMMUNITY): Payer: Self-pay

## 2017-07-29 ENCOUNTER — Encounter (HOSPITAL_COMMUNITY): Payer: Self-pay

## 2017-07-29 ENCOUNTER — Inpatient Hospital Stay (HOSPITAL_COMMUNITY)
Admission: AD | Admit: 2017-07-29 | Discharge: 2017-07-30 | Disposition: A | Discharge status: Other Acute Inpatient Hospital | Payer: Managed Care, Other (non HMO) | Source: Ambulatory Visit | Attending: Emergency Medicine | Admitting: Emergency Medicine

## 2017-07-29 ENCOUNTER — Inpatient Hospital Stay (HOSPITAL_COMMUNITY): Payer: Managed Care, Other (non HMO)

## 2017-07-29 DIAGNOSIS — F329 Major depressive disorder, single episode, unspecified: Secondary | ICD-10-CM | POA: Insufficient documentation

## 2017-07-29 DIAGNOSIS — R079 Chest pain, unspecified: Secondary | ICD-10-CM | POA: Diagnosis present

## 2017-07-29 DIAGNOSIS — Z3A31 31 weeks gestation of pregnancy: Secondary | ICD-10-CM | POA: Diagnosis not present

## 2017-07-29 DIAGNOSIS — O88213 Thromboembolism in pregnancy, third trimester: Secondary | ICD-10-CM | POA: Diagnosis not present

## 2017-07-29 DIAGNOSIS — O99343 Other mental disorders complicating pregnancy, third trimester: Secondary | ICD-10-CM | POA: Diagnosis not present

## 2017-07-29 DIAGNOSIS — O26893 Other specified pregnancy related conditions, third trimester: Secondary | ICD-10-CM | POA: Insufficient documentation

## 2017-07-29 DIAGNOSIS — R55 Syncope and collapse: Secondary | ICD-10-CM

## 2017-07-29 DIAGNOSIS — I2699 Other pulmonary embolism without acute cor pulmonale: Secondary | ICD-10-CM | POA: Diagnosis not present

## 2017-07-29 DIAGNOSIS — I2609 Other pulmonary embolism with acute cor pulmonale: Secondary | ICD-10-CM | POA: Diagnosis not present

## 2017-07-29 LAB — CBC WITH DIFFERENTIAL/PLATELET
BASOS ABS: 0 10*3/uL (ref 0.0–0.1)
Basophils Relative: 0 %
EOS ABS: 0.2 10*3/uL (ref 0.0–0.7)
EOS PCT: 1 %
HCT: 35 % — ABNORMAL LOW (ref 36.0–46.0)
Hemoglobin: 11.8 g/dL — ABNORMAL LOW (ref 12.0–15.0)
LYMPHS ABS: 1.6 10*3/uL (ref 0.7–4.0)
Lymphocytes Relative: 12 %
MCH: 31.1 pg (ref 26.0–34.0)
MCHC: 33.7 g/dL (ref 30.0–36.0)
MCV: 92.1 fL (ref 78.0–100.0)
Monocytes Absolute: 0.3 10*3/uL (ref 0.1–1.0)
Monocytes Relative: 2 %
Neutro Abs: 10.9 10*3/uL — ABNORMAL HIGH (ref 1.7–7.7)
Neutrophils Relative %: 85 %
PLATELETS: 192 10*3/uL (ref 150–400)
RBC: 3.8 MIL/uL — AB (ref 3.87–5.11)
RDW: 12.7 % (ref 11.5–15.5)
WBC: 13 10*3/uL — AB (ref 4.0–10.5)

## 2017-07-29 LAB — BASIC METABOLIC PANEL
Anion gap: 8 (ref 5–15)
BUN: 9 mg/dL (ref 6–20)
CALCIUM: 8.8 mg/dL — AB (ref 8.9–10.3)
CO2: 21 mmol/L — AB (ref 22–32)
CREATININE: 0.47 mg/dL (ref 0.44–1.00)
Chloride: 105 mmol/L (ref 101–111)
Glucose, Bld: 101 mg/dL — ABNORMAL HIGH (ref 65–99)
Potassium: 3.7 mmol/L (ref 3.5–5.1)
Sodium: 134 mmol/L — ABNORMAL LOW (ref 135–145)

## 2017-07-29 LAB — D-DIMER, QUANTITATIVE: D-Dimer, Quant: 5.6 ug/mL-FEU — ABNORMAL HIGH (ref 0.00–0.50)

## 2017-07-29 LAB — TROPONIN I: TROPONIN I: 0.2 ng/mL — AB (ref <0.03)

## 2017-07-29 MED ORDER — HEPARIN BOLUS VIA INFUSION
5500.0000 [IU] | Freq: Once | INTRAVENOUS | Status: AC
  Administered 2017-07-29: 5500 [IU] via INTRAVENOUS
  Filled 2017-07-29: qty 5500

## 2017-07-29 MED ORDER — IOPAMIDOL (ISOVUE-370) INJECTION 76%: 100.0000 mL | Freq: Once | INTRAVENOUS | Status: DC | PRN

## 2017-07-29 MED ORDER — HEPARIN (PORCINE) IN NACL 100-0.45 UNIT/ML-% IJ SOLN
1450.0000 [IU]/h | INTRAMUSCULAR | Status: DC
  Administered 2017-07-29: 1450 [IU]/h via INTRAVENOUS
  Filled 2017-07-29: qty 250

## 2017-07-29 MED ORDER — HEPARIN SODIUM (PORCINE) 5000 UNIT/ML IJ SOLN: 60.0000 [IU]/kg | Freq: Once | INTRAMUSCULAR | Status: DC

## 2017-07-29 MED ORDER — LACTATED RINGERS IV BOLUS (SEPSIS)
1000.0000 mL | Freq: Once | INTRAVENOUS | Status: AC
  Administered 2017-07-29: 1000 mL via INTRAVENOUS

## 2017-07-29 NOTE — MAU Note (Signed)
Pt had chest pain around 1230, then had a syncopal episode. Arrived EMS. Pain 9/10 in chest with tightness. Back pain in middle of shoulders, fell on right side. No bleeding or LOF. EMS reported she was hypotensive

## 2017-07-29 NOTE — ED Notes (Signed)
[facility] rm 5157, Dr. Carlean PurlSara White receiving

## 2017-07-29 NOTE — ED Notes (Signed)
Awaiting heparin from pharmcy

## 2017-07-29 NOTE — ED Notes (Signed)
Report received from [facility], pt had syncopal episode at home and is having chest pain with shortness of breath. Pt sent from [facility] at bedside. Pt is [redacted] weeks pregnant

## 2017-07-29 NOTE — ED Notes (Signed)
OBRapidresponse paged to Dr. Adriana Simasook

## 2017-07-29 NOTE — Progress Notes (Signed)
tcf Dr. Chestine Sporelark. Will monitor patient tonight for 1-2 hours this evening. Possible q shift NST's to follow.

## 2017-07-29 NOTE — Discharge Instructions (Signed)
[facility], Dr. Harlan StainsSarah White attending

## 2017-07-29 NOTE — Progress Notes (Signed)
ANTICOAGULATION CONSULT NOTE  Pharmacy Consult for heparin Indication: pulmonary embolus  Heparin Dosing Weight: 81.5 kg   Assessment: 20 yof presenting with CP/SOB. Pharmacy consulted to dose heparin for large bilateral PE with RHS, extensive thrombus on R side. Noted, patient is [redacted]wks pregnant. Not on anticoagulation PTA. Hg 11.8, plt wnl. No bleed documented. Pt states weight is 207 lbs in the ED.  Goal of Therapy:  Heparin level 0.3-0.7 units/ml Monitor platelets by anticoagulation protocol: Yes   Plan:  Heparin 5500 unit bolus Start heparin at 1450 units/h 6h heparin level Daily heparin level/CBC Monitor s/sx bleeding    Babs BertinHaley Laronn Devonshire, PharmD, BCPS Clinical Pharmacist 07/29/2017 7:05 PM

## 2017-07-29 NOTE — ED Provider Notes (Addendum)
[facility]-EMERGENCY DEPT Provider Note   CSN: [id number] Arrival date & time: 07/29/17  1446     History   Chief Complaint Chief Complaint  Patient presents with  . Chest Pain  . Loss of Consciousness    HPI Victoria Sanders is a 21 y.o. female.  Level V caveat for urgent need for intervention. Patient is a G1 P0 approximately [redacted] weeks gestation presents with dyspnea, chest pain, diaphoresis approximate in today. No vaginal bleeding or discharge. Good fetal movement. Patient was evaluated at [facility] with a CT angiogram which revealed bilateral pulmonary embolism with right heart strain.  She has been transferred to the [facility] emergency department for further evaluation and probable admission.      Past Medical History:  Diagnosis Date  . Depression     Patient Active Problem List   Diagnosis Date Noted  . Suicidal ideations   . 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 NO PAST SURGERIES      OB History    Gravida Para Term Preterm AB Living   1 0 0 0 0 0   SAB TAB Ectopic Multiple Live Births   0 0 0 0         Home Medications    Prior to Admission medications   Medication Sig Start Date End Date Taking? Authorizing Provider  busPIRone (BUSPAR) 10 MG tablet Take 10 mg by mouth 2 (two) times daily. 06/09/17  Yes [provider]  Prenatal Vit-Fe Fumarate-FA (PRENATAL MULTIVITAMIN) TABS tablet Take 1 tablet by mouth daily at 12 noon.   Yes [provider]  propranolol (INDERAL) 10 MG tablet Take 10 mg by mouth as needed (for anxiety).    Yes [provider]  sertraline (ZOLOFT) 50 MG tablet Take 75 mg by mouth daily.   Yes [provider]    Family History Family History  Problem Relation Age of Onset  . Diabetes Maternal Uncle   . Stroke Maternal Uncle   . Cancer Paternal Grandmother        BRAIN TUMOR   .  Diabetes Paternal Grandmother   . Cancer Paternal Grandfather        LUNG     Social History Social History  Substance Use Topics  . Smoking status: Never Smoker  . Smokeless tobacco: Never Used  . Alcohol use No     Allergies   Patient has no known allergies.   Review of Systems Review of Systems  Unable to perform ROS: Acuity of condition     Physical Exam Updated Vital Signs BP 116/65   Pulse (!) 106   Temp 98 F (36.7 C)   Resp 20   Ht 5\' 7"  (1.702 m)   Wt 92.1 kg (203 lb)   LMP 12/21/2016   SpO2 100%   BMI 31.79 kg/m   Physical Exam  Constitutional: She is oriented to person, place, and time. She appears well-developed and well-nourished.  No frank dyspnea  HENT:  Head: Normocephalic and atraumatic.  Eyes: Conjunctivae are normal.  Neck: Neck supple.  Cardiovascular: Normal rate and regular rhythm.   Pulmonary/Chest: Effort normal and breath sounds normal.  Abdominal: Soft. Bowel sounds are normal.  gravid  Musculoskeletal: Normal range of motion.  Neurological: She is alert and oriented to person, place, and time.  Skin: Skin is warm and dry.  Psychiatric: She has a normal mood and affect.  Her behavior is normal.  Nursing note and vitals reviewed.    ED Treatments / Results  Labs (all labs ordered are listed, but only abnormal results are displayed) Labs Reviewed  CBC WITH DIFFERENTIAL/PLATELET - Abnormal; Notable for the following:       Result Value   WBC 13.0 (*)    RBC 3.80 (*)    Hemoglobin 11.8 (*)    HCT 35.0 (*)    Neutro Abs 10.9 (*)    All other components within normal limits  TROPONIN I - Abnormal; Notable for the following:    Troponin I 0.20 (*)    All other components within normal limits  D-DIMER, QUANTITATIVE (NOT AT [facility]) - Abnormal; Notable for the following:    D-Dimer, Quant 5.60 (*)    All other components within normal limits  BASIC METABOLIC PANEL - Abnormal; Notable for the following:    Sodium 134 (*)    CO2  21 (*)    Glucose, Bld 101 (*)    Calcium 8.8 (*)    All other components within normal limits  HEPARIN LEVEL (UNFRACTIONATED)  CBC    EKG  EKG Interpretation None       Radiology Dg Chest 2 View  Result Date: 07/29/2017 CLINICAL DATA:  Acute onset chest pain and syncopal episode today. Shortness of breath. Thirty-two weeks pregnant. EXAM: CHEST  2 VIEW COMPARISON:  None. FINDINGS: The heart size and mediastinal contours are within normal limits. Both lungs are clear. No evidence of pneumothorax or pleural effusion. The visualized skeletal structures are unremarkable. IMPRESSION: No active cardiopulmonary disease. Electronically Signed   By: Myles Rosenthal M.D.   On: 07/29/2017 15:44   Ct Angio Chest Pe W Or Wo Contrast  Result Date: 07/29/2017 CLINICAL DATA:  Mid chest pain and tightness with shortness of breath. Positive D-dimer. EXAM: CT ANGIOGRAPHY CHEST WITH CONTRAST TECHNIQUE: Multidetector CT imaging of the chest was performed using the standard protocol during bolus administration of intravenous contrast. Multiplanar CT image reconstructions and MIPs were obtained to evaluate the vascular anatomy. CONTRAST:  100 mL Isovue 370 IV COMPARISON:  Chest x-ray earlier today. FINDINGS: Cardiovascular: Large volume submassive acute pulmonary embolism is present bilaterally. On the right side, thrombus at the level of the distal right main pulmonary artery is nearly occlusive at the level of the lower lobe pulmonary artery nonocclusive at the orifice of the right middle lobe pulmonary artery. There also is nonocclusive thrombus extending into the upper lobe pulmonary artery. Acute thrombus on the left is nearly occlusive in the lower lobe pulmonary artery and extends into a posterior basilar segmental branch. There is evidence of associated probable right heart strain with obvious right ventricular cavity dilatation and estimated RV/LV ratio of 1.6. Mediastinum/Nodes: No enlarged mediastinal, hilar,  or axillary lymph nodes. Thyroid gland, trachea, and esophagus demonstrate no significant findings. Lungs/Pleura: There is no evidence of pulmonary edema, consolidation, pneumothorax, nodule or pleural fluid. Upper Abdomen: No acute abnormality. Musculoskeletal: No chest wall abnormality. No acute or significant osseous findings. Review of the MIP images confirms the above findings. IMPRESSION: Large volume submassive acute pulmonary embolism bilaterally with associated CT evidence of right heart strain and estimated RV/LV ratio of 1.6. Extensive thrombus on the right side extends into all lobar branches and is nearly occlusive in the right lower lobe pulmonary artery. Thrombus on the left is nearly occlusive in the left lower lobe pulmonary artery. No associated pulmonary edema or CT evidence of pulmonary infarction. Positive for acute  PE with CT evidence of right heart strain (RV/LV Ratio = 1.6) consistent with at least submassive (intermediate risk) PE. The presence of right heart strain has been associated with an increased risk of morbidity and mortality. These results were called by telephone at the time of interpretation on 07/29/2017 at 5:15 pm to Dr. Nolene EbbsJULIE DEGELE, who verbally acknowledged these results. Electronically Signed   By: Irish LackGlenn  Yamagata M.D.   On: 07/29/2017 17:17    Procedures Procedures (including critical care time)  Medications Ordered in ED Medications  iopamidol (ISOVUE-370) 76 % injection 100 mL (not administered)  heparin ADULT infusion 100 units/mL (25000 units/27550mL sodium chloride 0.45%) (1,450 Units/hr Intravenous New Bag/Given 07/29/17 1959)  heparin bolus via infusion 5,500 Units (not administered)  lactated ringers bolus 1,000 mL (0 mLs Intravenous Stopped 07/29/17 1851)     Initial Impression / Assessment and Plan / ED Course  I have reviewed the triage vital signs and the nursing notes.  Pertinent labs & imaging results that were available during my care of the  patient were reviewed by me and considered in my medical decision making (see chart for details).     CRITICAL CARE Performed by: Donnetta HutchingOOK,Goble Fudala Total critical care time: 60 minutes Critical care time was exclusive of separately billable procedures and treating other patients. Critical care was necessary to treat or prevent imminent or life-threatening deterioration. Critical care was time spent personally by me on the following activities: development of treatment plan with patient and/or surrogate as well as nursing, discussions with consultants, evaluation of patient's response to treatment, examination of patient, obtaining history from patient or surrogate, ordering and performing treatments and interventions, ordering and review of laboratory studies, ordering and review of radiographic studies, pulse oximetry and re-evaluation of patient's condition.   Patient is slightly tachycardic, but otherwise stable vital signs. CT angiogram chest reviewed which revealed bilateral pulmonary embolism with right heart strain. I discussed her care with the obstetrician on call Dr. Chestine Sporelark at [phone] and also the maternal-fetal medicine specialist [facility] Dr[facility] Claudean SeveranceWhitecar at [phone].  Heparin was initiated and admission discussed with Dr. Julian ReilGardner.  Will also request attendance of OB rapid response team.  2130:  [facility] was unable to accommodate 24-hour OB monitoring.I discussed the case with Dr. Harlan StainsSarah White attending physician for labor and delivery at [facility].  She will accept transfer.  Final Clinical Impressions(s) / ED Diagnoses   Final diagnoses:  Bilateral pulmonary embolism (HCC)  [redacted] weeks gestation of pregnancy    New Prescriptions New Prescriptions   No medications on file     Donnetta Hutchingook, Devine Dant, MD 07/29/17 2008    Donnetta Hutchingook, Angles Trevizo, MD 07/29/17 2138    Donnetta Hutchingook, Genavie Boettger, MD 07/29/17 [phone]

## 2017-07-29 NOTE — MAU Note (Signed)
Report given to Domingo Pulsearelink, Caleb RN, will be arriving in 10-15 minutes.

## 2017-07-29 NOTE — Progress Notes (Signed)
CRITICAL VALUE ALERT  Critical Value:  Troponin 0.2  Date & Time Notied:  07/29/17 1614  Provider Notified: Dr. Nira Retortegele  Orders Received/Actions taken: Pt will be monitored further and additional tests were ordered.

## 2017-07-29 NOTE — Progress Notes (Signed)
Tct: Dr. Chestine Sporelark Updated on patient, reassuring FHT and UI. Advised patient reports no pain associated with UI. Will continue fetal monitoring for 2 hours or until patient is transferred. ED Provider  contacting receiving facility/provider.

## 2017-07-29 NOTE — Progress Notes (Signed)
Call[facility] from [facility] for patient [redacted]week gestation with PE. [facility] Called Dr. Chestine Sporelark regarding patient and request for continous monitoring.  Dr. Chestine Sporelark and Ed provider to discuss need for tranport to facility with OB and acute care under same roof.

## 2017-07-29 NOTE — MAU Note (Signed)
Labs drawn, EKG done, and x-ray done, awaiting results. Pt still feels short of breath and chest pains. Is not feeling the back pain currently.

## 2017-07-29 NOTE — Progress Notes (Signed)
[facility] in to transfer [facility] room [phone]

## 2017-07-29 NOTE — MAU Provider Note (Signed)
History     CSN: [id number] Arrival date and time: 07/29/17 1446  First Provider Initiated Contact with Patient 07/29/17 1500      Chief Complaint  Patient presents with  . Chest Pain  . Loss of Consciousness    HPI: Victoria Sanders is a 21 y.o. G1P0000 with IUP at [redacted]w[redacted]d, who presents to maternity admissions s/p syncopal episode. She reports midsternal chest pain, SOB and diaphoresis at 1230. She passed out and fell on her right side. Fall was unwitnessed. Does not think she had any head or abdominal trauma. She called her OB after episode who advised her to call EMS.   Patient reports still having some chest tightness and SOB. Chest pain is midsternal without radiation. Denies LE swelling or pain. Reports having symptoms like this before, but had an episode syncope early in pregnancy, which she states happened in the setting of a lot of nausea and vomiting.   Denies contractions, leakage of fluid or vaginal bleeding. Reports good fetal movement.   Reports she has been otherwise doing well, and in good health up until this morning. Denies any PMH, including no personal or family h/o blood clots.   She receives [facility]. Repots her pregnancy has been uncomplicated other than h/o depression.  Past obstetric history: OB History  Gravida Para Term Preterm AB Living  1 0 0 0 0 0  SAB TAB Ectopic Multiple Live Births  0 0 0 0      # Outcome Date GA Lbr Len/2nd Weight Sex Delivery Anes PTL Lv  1 Current               Past Medical History:  Diagnosis Date  . Depression     Past Surgical History:  Procedure Laterality Date  . NO PAST SURGERIES      Family History  Problem Relation Age of Onset  . Diabetes Maternal Uncle   . Stroke Maternal Uncle   . Cancer Paternal Grandmother        BRAIN TUMOR   . Diabetes Paternal Grandmother   . Cancer Paternal Grandfather        LUNG     Social History  Substance Use Topics  . Smoking status: Never Smoker  .  Smokeless tobacco: Never Used  . Alcohol use No    Allergies: No Known Allergies  Prescriptions Prior to Admission  Medication Sig Dispense Refill Last Dose  . busPIRone (BUSPAR) 10 MG tablet Take 10 mg by mouth 2 (two) times daily.   07/02/2017 at Unknown time  . propranolol (INDERAL) 10 MG tablet Take 10 mg by mouth as needed (for anxiety).    Past Month at Unknown time  . sertraline (ZOLOFT) 50 MG tablet Take 75 mg by mouth daily.   07/02/2017 at Unknown time    Review of Systems - Negative except for what is mentioned in HPI.  Physical Exam   Blood pressure 128/71, pulse (!) 114, temperature 97.9 F (36.6 C), resp. rate 18, last menstrual period 12/21/2016, SpO2 95 %.  Constitutional: Well-developed, well-nourished female. Slightly increased WOB, but otherwise in NAD. Not diaphoretic.   Cardiovascular: Tachycardic, normal rhythm. No murmur, rubs or gallops. Peripheral pulses 2+ bilaterally.  Respiratory: Mildly tachypneic, not using accessory muscles. Lungs CTAB without wheezes rales or rhonchi.  GI: Abd soft, non-tender, gravid appropriate for gestational age.   Pelvic: deferred MSK: Extremities nontender, no edema. No calf tenderness, and negative Homan's sign bilaterally. Neurologic: Alert and oriented x  4. Psych: Normal mood and affect   FHT:  Baseline 140 , moderate variability, accelerations present, no decelerations Toco: no contractions  MAU Course  Procedures  MDM 3:16 PM -- Pt seen and examined. EKG, CXR, CBC, BMP, Trop, and D-dimer ordered (Well's score 4.5, moderate risk). IV started. 3:34 PM -- EKG shows sinus tachycardia. ST or T segment changes concerning for ischemic changes 3:48 PM -- CXR reviewed. No pneumothorax or other acute pulmonary process.  4:12 PM -- Elevated D-dimer. Will get chest CTA 4:15 PM -- Troponin 0.20. Suspect heart strain secondary to PE. 4:31 PM -- Spoke with Dr. Cathren LaineKevin Steinl at [facility], who will accept transfer. Pt will get CTA  here prior transfer. Will need management of PE vs further cardiac work if CTA negative. 5:22 PM -- Received call from radiologist, Dr. Fredia SorrowYamagata. CTA showed bilateral PE with evidence of right hear strain. He advised that patient may benefit from catheter-guided lytic therapy. I have called [facility] Denton [facility]Steinl [facility] and updated him on results Pt remain hemodynamically stable. Awaiting transport.   Assessment and Plan   21 y.o. G1P0 female with IUP at 6456w3d who presents for syncope, chest pain and SOB. Work up c/w submassive PE with right heart strain.   [facility]po[facility] management.   Degele, Kandra NicolasJulie P, MD 07/29/2017 5:24 PM

## 2017-07-29 NOTE — MAU Note (Signed)
C-spine collar cleared by Dr Nira Retortegele

## 2017-07-29 NOTE — ED Notes (Signed)
Rapid ob paged states since she was cleared by ob there is no need for continues fetal monitoring at this time, if anything changes in the patients symptoms like abdominal pain or we have any doubt that baby is okay to call them back, and to also call them back if she gets admitted.

## 2017-07-30 DIAGNOSIS — Z3A31 31 weeks gestation of pregnancy: Secondary | ICD-10-CM

## 2017-07-30 DIAGNOSIS — I2699 Other pulmonary embolism without acute cor pulmonale: Secondary | ICD-10-CM | POA: Diagnosis present

## 2017-07-30 NOTE — Consult Note (Addendum)
Medical Consultation   Victoria NewportCarlie Ann Sanders  WUJ:[id number]  DOB: April 06, 1996  DOA: 07/29/2017  PCP: Patient, No Pcp Per  Outpatient Specialists: Dr. Chestine Sporelark (OB/GYN)   Requesting physician: Dr. Adriana Simasook  Reason for consultation: Acute submassive PE.   History of Present Illness: Victoria Sanders is an 21 y.o. female G1 P0 at approximately [redacted] weeks gestation.  [facility]/o chest pain and dyspnea.  [facility].  Symptoms onset earlier today.  Good fetal movement and heart tones [facility].  [facility] revealed submassive bilateral PE with R heart strain.  Her troponin is 0.20.  Heparin gtt was initiated in ED.   Review of Systems:  ROS As per HPI otherwise 10 point review of systems negative.    Past Medical History: Past Medical History:  Diagnosis Date  . Depression     Past Surgical History: Past Surgical History:  Procedure Laterality Date  . NO PAST SURGERIES       Allergies:  No Known Allergies   Social History:  reports that she has never smoked. She has never used smokeless tobacco. She reports that she does not drink alcohol or use drugs.   Family History: Family History  Problem Relation Age of Onset  . Diabetes Maternal Uncle   . Stroke Maternal Uncle   . Cancer Paternal Grandmother        BRAIN TUMOR   . Diabetes Paternal Grandmother   . Cancer Paternal Grandfather        LUNG      Physical Exam: Vitals:   07/29/17 2215 07/29/17 2230 07/29/17 2245 07/29/17 2300  BP: (!) 117/59 (!) 92/58 108/75 108/64  Pulse: (!) 124 (!) 126 (!) 112 (!) 115  Resp: (!) 25 (!) 26 (!) 23 (!) 21  Temp:      SpO2: 97% 98% 97% 97%  Weight:      Height:        Constitutional: Alert and awake, oriented x3, not in any acute distress. Eyes: PERLA, EOMI, irises appear normal, anicteric sclera,  ENMT: external ears and nose appear normal  Lips appears normal,  oropharynx mucosa, tongue, posterior pharynx appear normal  Neck: neck appears normal, no masses, normal ROM, no thyromegaly, no JVD  CVS: Tachycardic, regular rhythm.  Respiratory:  clear to auscultation bilaterally, no wheezing, rales or rhonchi. Respiratory effort normal. No accessory muscle use.  Abdomen: soft nontender, nondistended, normal bowel sounds, no hepatosplenomegaly, no hernias  Musculoskeletal: : no cyanosis, clubbing or edema noted bilaterally Neuro: Cranial nerves II-XII intact, strength, sensation, reflexes Psych: Anxious about self and fetus. Skin: no rashes or lesions or ulcers, no induration or nodules   Data reviewed:  I have personally reviewed following labs and imaging studies Labs:  CBC:  Recent Labs Lab 07/29/17 1513  WBC 13.0*  NEUTROABS 10.9*  HGB 11.8*  HCT 35.0*  MCV 92.1  PLT 192    Basic Metabolic Panel:  Recent Labs Lab 07/29/17 1513  NA 134*  K 3.7  CL 105  CO2 21*  GLUCOSE 101*  BUN 9  CREATININE 0.47  CALCIUM 8.8*   GFR Estimated Creatinine Clearance: 131.8 mL/min (by C-G formula based on SCr of 0.47 mg/dL). Liver Function Tests: No results for input(s): AST, ALT, ALKPHOS, BILITOT, PROT, ALBUMIN in the last 168 hours. No results for input(s):  LIPASE, AMYLASE in the last 168 hours. No results for input(s): AMMONIA in the last 168 hours. Coagulation profile No results for input(s): INR, PROTIME in the last 168 hours.  Cardiac Enzymes:  Recent Labs Lab 07/29/17 1513  TROPONINI 0.20*   BNP: Invalid input(s): POCBNP CBG: No results for input(s): GLUCAP in the last 168 hours. D-Dimer  Recent Labs  07/29/17 1513  DDIMER 5.60*   Hgb A1c No results for input(s): HGBA1C in the last 72 hours. Lipid Profile No results for input(s): CHOL, HDL, LDLCALC, TRIG, CHOLHDL, LDLDIRECT in the last 72 hours. Thyroid function studies No results for input(s): TSH, T4TOTAL, T3FREE, THYROIDAB in the last 72 hours.  Invalid input(s):  FREET3 Anemia work up No results for input(s): VITAMINB12, FOLATE, FERRITIN, TIBC, IRON, RETICCTPCT in the last 72 hours.   Microbiology No results found for this or any previous visit (from the past 240 hour(s)).     Inpatient Medications:   Scheduled Meds: Continuous Infusions: . heparin 1,450 Units/hr (07/29/17 1959)     Radiological Exams on Admission: Dg Chest 2 View  Result Date: 07/29/2017 CLINICAL DATA:  Acute onset chest pain and syncopal episode today. Shortness of breath. Thirty-two weeks pregnant. EXAM: CHEST  2 VIEW COMPARISON:  None. FINDINGS: The heart size and mediastinal contours are within normal limits. Both lungs are clear. No evidence of pneumothorax or pleural effusion. The visualized skeletal structures are unremarkable. IMPRESSION: No active cardiopulmonary disease. Electronically Signed   By: Myles Rosenthal M.D.   On: 07/29/2017 15:44   Ct Angio Chest Pe W Or Wo Contrast  Result Date: 07/29/2017 CLINICAL DATA:  Mid chest pain and tightness with shortness of breath. Positive D-dimer. EXAM: CT ANGIOGRAPHY CHEST WITH CONTRAST TECHNIQUE: Multidetector CT imaging of the chest was performed using the standard protocol during bolus administration of intravenous contrast. Multiplanar CT image reconstructions and MIPs were obtained to evaluate the vascular anatomy. CONTRAST:  100 mL Isovue 370 IV COMPARISON:  Chest x-ray earlier today. FINDINGS: Cardiovascular: Large volume submassive acute pulmonary embolism is present bilaterally. On the right side, thrombus at the level of the distal right main pulmonary artery is nearly occlusive at the level of the lower lobe pulmonary artery nonocclusive at the orifice of the right middle lobe pulmonary artery. There also is nonocclusive thrombus extending into the upper lobe pulmonary artery. Acute thrombus on the left is nearly occlusive in the lower lobe pulmonary artery and extends into a posterior basilar segmental branch. There is  evidence of associated probable right heart strain with obvious right ventricular cavity dilatation and estimated RV/LV ratio of 1.6. Mediastinum/Nodes: No enlarged mediastinal, hilar, or axillary lymph nodes. Thyroid gland, trachea, and esophagus demonstrate no significant findings. Lungs/Pleura: There is no evidence of pulmonary edema, consolidation, pneumothorax, nodule or pleural fluid. Upper Abdomen: No acute abnormality. Musculoskeletal: No chest wall abnormality. No acute or significant osseous findings. Review of the MIP images confirms the above findings. IMPRESSION: Large volume submassive acute pulmonary embolism bilaterally with associated CT evidence of right heart strain and estimated RV/LV ratio of 1.6. Extensive thrombus on the right side extends into all lobar branches and is nearly occlusive in the right lower lobe pulmonary artery. Thrombus on the left is nearly occlusive in the left lower lobe pulmonary artery. No associated pulmonary edema or CT evidence of pulmonary infarction. Positive for acute PE with CT evidence of right heart strain (RV/LV Ratio = 1.6) consistent with at least submassive (intermediate risk) PE. The presence of right heart strain  has been associated with an increased risk of morbidity and mortality. These results were called by telephone at the time of interpretation on 07/29/2017 at 5:15 pm to Dr. Nolene Ebbs, who verbally acknowledged these results. Electronically Signed   By: Irish Lack M.D.   On: 07/29/2017 17:17    Impression/Recommendations Principal Problem:   Acute pulmonary embolism (HCC) Active Problems:   [redacted] weeks gestation of pregnancy  1. Acute, bilateral, submassive pulmonary embolism - with CT and cardiac marker evidence of right heart strain, in a patient with viable pregnancy. 1. Planned to admit patient to acute care hospital on heparin gtt with continuous fetal heart tone monitoring. 2. Unfortunately I am informed by Dr. Chestine Spore that she  called and spoke with [facility] but that they are unable to do continuous fetal heart tone monitoring here at [facility].  This is confirmed to me by rapid response OB. 3. Dr. Chestine Spore did agree that request for continuous fetal heart tone monitoring was more than reasonable in this patient with viable pregnancy. 4. And certainly I think patient needs at least a SDU level of care which cannot be provided over at [facility]. 5. Thus Dr. Chestine Spore spoke with Dr. Claudean Severance (MFM on call).  And they agreed that best option for patient would be transfer to [facility] where she could be both on an appropriate level care unit in an acute care hospital setting AND have continuous fetal heart tone monitoring.  As well as MFM involvement in what is now clearly a high risk situation.    Time Spent: 110 min  Victoria Sanders M. D.O. [facility] 07/30/2017, 2:35 AM

## 2017-08-01 MED ORDER — IOPAMIDOL (ISOVUE-370) INJECTION 76%
100.0000 mL | Freq: Once | INTRAVENOUS | Status: AC | PRN
  Administered 2017-08-01: 100 mL via INTRAVENOUS

## 2017-12-20 ENCOUNTER — Encounter (HOSPITAL_COMMUNITY): Payer: Self-pay

## 2018-05-04 ENCOUNTER — Emergency Department (HOSPITAL_COMMUNITY)
Admission: EM | Admit: 2018-05-04 | Discharge: 2018-05-04 | Disposition: A | Discharge status: Home / Self Care | Payer: Medicaid Other | Attending: Emergency Medicine | Admitting: Emergency Medicine

## 2018-05-04 ENCOUNTER — Encounter (HOSPITAL_COMMUNITY): Payer: Self-pay

## 2018-05-04 ENCOUNTER — Other Ambulatory Visit: Payer: Self-pay

## 2018-05-04 DIAGNOSIS — H66011 Acute suppurative otitis media with spontaneous rupture of ear drum, right ear: Secondary | ICD-10-CM | POA: Insufficient documentation

## 2018-05-04 DIAGNOSIS — H9390 Unspecified disorder of ear, unspecified ear: Secondary | ICD-10-CM | POA: Diagnosis present

## 2018-05-04 DIAGNOSIS — Z79899 Other long term (current) drug therapy: Secondary | ICD-10-CM | POA: Diagnosis not present

## 2018-05-04 DIAGNOSIS — H66001 Acute suppurative otitis media without spontaneous rupture of ear drum, right ear: Secondary | ICD-10-CM

## 2018-05-04 MED ORDER — FLUTICASONE PROPIONATE 50 MCG/ACT NA SUSP: 2.0000 | Freq: Every day | NASAL | 0 refills | Status: AC

## 2018-05-04 MED ORDER — NAPROXEN 250 MG PO TABS
500.0000 mg | ORAL_TABLET | Freq: Once | ORAL | Status: AC
  Administered 2018-05-04: 500 mg via ORAL
  Filled 2018-05-04: qty 2

## 2018-05-04 MED ORDER — NAPROXEN 500 MG PO TABS: 500.0000 mg | ORAL_TABLET | Freq: Two times a day (BID) | ORAL | 0 refills | Status: DC

## 2018-05-04 MED ORDER — AMOXICILLIN 500 MG PO CAPS: 500.0000 mg | ORAL_CAPSULE | Freq: Three times a day (TID) | ORAL | 0 refills | Status: DC

## 2018-05-04 NOTE — ED Provider Notes (Signed)
[facility] EMERGENCY DEPARTMENT Provider Note   CSN: [id number] Arrival date & time: 05/04/18  0257     History   Chief Complaint Chief Complaint  Patient presents with  . Ear Fullness    HPI Victoria Sanders is a 22 y.o. female.  HPI  This is a 22 year old female with no significant past medical history who presents with right ear pain.  Patient reports that she went to bed last night with an earache.  She woke up this morning with continued pain and difficulty hearing out of that ear.  Denies any recent significant illnesses.  She has had a cough.  No significant congestion.  She has not put anything in her ear.  She rates her pain at 9 out of 10.  She has not taken anything for the pain.  She denies fevers.  Past Medical History:  Diagnosis Date  . Depression     Patient Active Problem List   Diagnosis Date Noted  . Acute pulmonary embolism (HCC) 07/30/2017  . [redacted] weeks gestation of pregnancy 07/30/2017  . Suicidal ideations   . 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 NO PAST SURGERIES       OB History    Gravida  1   Para  0   Term  0   Preterm  0   AB  0   Living  0     SAB  0   TAB  0   Ectopic  0   Multiple  0   Live Births               Home Medications    Prior to Admission medications   Medication Sig Start Date End Date Taking? Authorizing Provider  amoxicillin (AMOXIL) 500 MG capsule Take 1 capsule (500 mg total) by mouth 3 (three) times daily. 05/04/18   Nihar Klus, Mayer Masker, MD  busPIRone (BUSPAR) 10 MG tablet Take 10 mg by mouth 2 (two) times daily. 06/09/17   [provider]  fluticasone (FLONASE) 50 MCG/ACT nasal spray Place 2 sprays into both nostrils daily. 05/04/18   Shaynna Husby, Mayer Masker, MD  naproxen (NAPROSYN) 500 MG tablet Take 1 tablet (500 mg total) by mouth 2 (two) times daily. 05/04/18    Arielys Wandersee, Mayer Masker, MD  Prenatal Vit-Fe Fumarate-FA (PRENATAL MULTIVITAMIN) TABS tablet Take 1 tablet by mouth daily at 12 noon.    [provider]  propranolol (INDERAL) 10 MG tablet Take 10 mg by mouth as needed (for anxiety).     [provider]  sertraline (ZOLOFT) 50 MG tablet Take 75 mg by mouth daily.    [provider]    Family History Family History  Problem Relation Age of Onset  . Diabetes Maternal Uncle   . Stroke Maternal Uncle   . Cancer Paternal Grandmother        BRAIN TUMOR   . Diabetes Paternal Grandmother   . Cancer Paternal Grandfather        LUNG     Social History Social History   Tobacco Use  . Smoking status: Never Smoker  . Smokeless tobacco: Never Used  Substance Use Topics  . Alcohol use: No    Alcohol/week: 0.0 oz  . Drug use: No     Allergies   Patient has no known allergies.   Review of Systems Review of Systems  Constitutional: Negative for fever.  HENT: Positive for ear pain. Negative for congestion and facial swelling.   Respiratory: Positive for cough.   Cardiovascular: Negative for chest pain.  Gastrointestinal: Negative for nausea and vomiting.  All other systems reviewed and are negative.    Physical Exam Updated Vital Signs BP 126/80   Pulse 66   Temp 98.6 F (37 C) (Oral)   Resp 18   SpO2 100%   Physical Exam  Constitutional: She is oriented to person, place, and time. She appears well-developed and well-nourished. No distress.  HENT:  Head: Normocephalic and atraumatic.  Right TM bulging and erythematous, distorted light reflex, purulent effusion noted  Eyes: Pupils are equal, round, and reactive to light.  Cardiovascular: Normal rate and regular rhythm.  Pulmonary/Chest: Effort normal. No respiratory distress.  Abdominal: Soft. There is no tenderness.  Neurological: She is alert and oriented to person, place, and time.  Skin: Skin is warm and dry.  Psychiatric: She has a normal mood  and affect.  Nursing note and vitals reviewed.    ED Treatments / Results  Labs (all labs ordered are listed, but only abnormal results are displayed) Labs Reviewed - No data to display  EKG None  Radiology No results found.  Procedures Procedures (including critical care time)  Medications Ordered in ED Medications  naproxen (NAPROSYN) tablet 500 mg (has no administration in time range)     Initial Impression / Assessment and Plan / ED Course  I have reviewed the triage vital signs and the nursing notes.  Pertinent labs & imaging results that were available during my care of the patient were reviewed by me and considered in my medical decision making (see chart for details).     Patient presents with right ear pain and difficulty hearing.  She has evidence of acute otitis on exam.  She is otherwise nontoxic and afebrile.  Will discharge with Flonase, naproxen, and amoxicillin.  After history, exam, and medical workup I feel the patient has been appropriately medically screened and is safe for discharge home. Pertinent diagnoses were discussed with the patient. Patient was given return precautions.   Final Clinical Impressions(s) / ED Diagnoses   Final diagnoses:  Non-recurrent acute suppurative otitis media of right ear without spontaneous rupture of tympanic membrane    ED Discharge Orders        Ordered    amoxicillin (AMOXIL) 500 MG capsule  3 times daily     05/04/18 0619    fluticasone (FLONASE) 50 MCG/ACT nasal spray  Daily     05/04/18 0619    naproxen (NAPROSYN) 500 MG tablet  2 times daily     05/04/18 0619       Shon Baton, MD 05/04/18 [phone]

## 2018-05-04 NOTE — ED Triage Notes (Signed)
Pt reports R ear pain since L night.

## 2019-02-05 IMAGING — US US ABDOMEN LIMITED
1 series · 14 of 25 positions shown · non-contrast
Comparison: None.

CLINICAL DATA: 20-year-old female with right upper quadrant
abdominal pain.

EXAM:
ULTRASOUND ABDOMEN LIMITED RIGHT UPPER QUADRANT

[Series 1: us abdomen limited · 0.17mm/px · 14 of 44 slices shown]
[im 1/44]
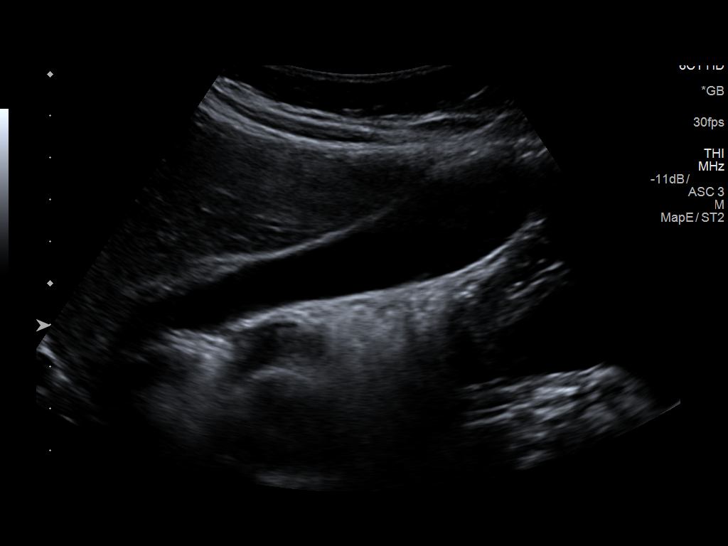
[im 4/44]
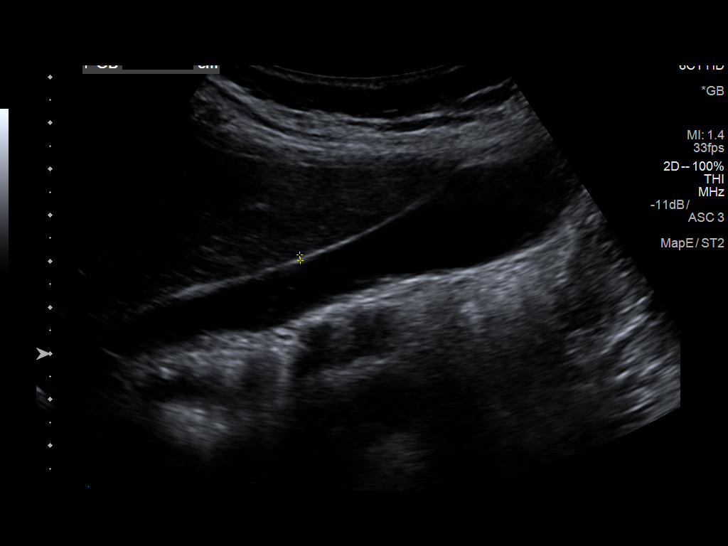
[im 8/44]
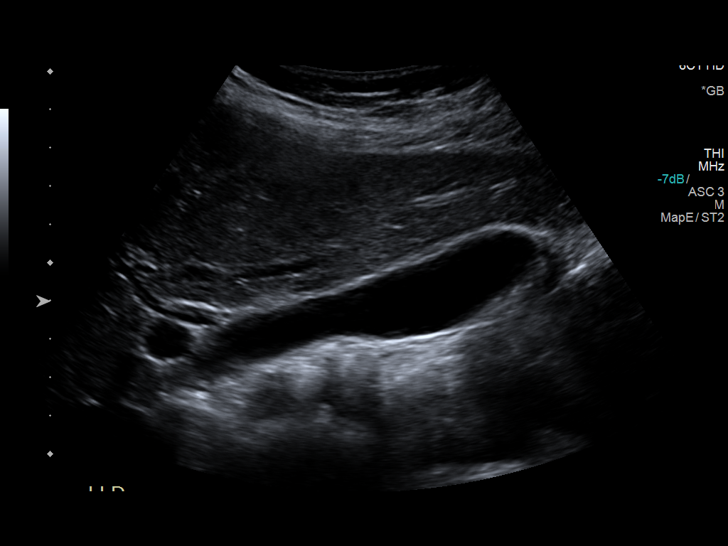
[im 11/44]
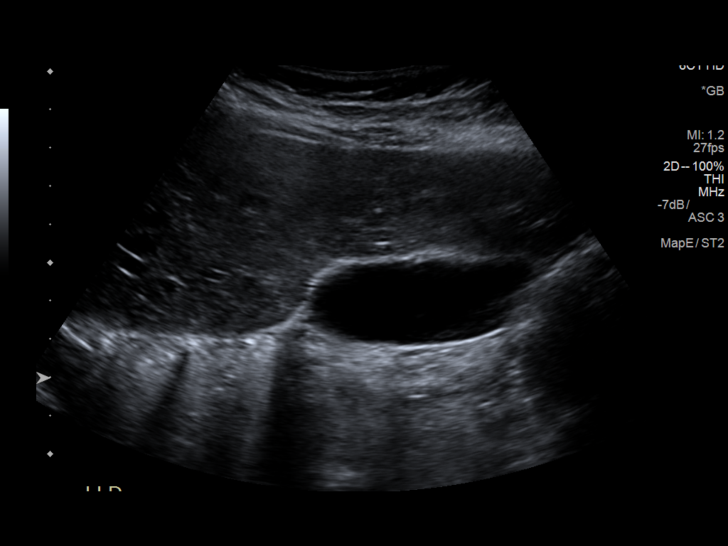
[im 15/44]
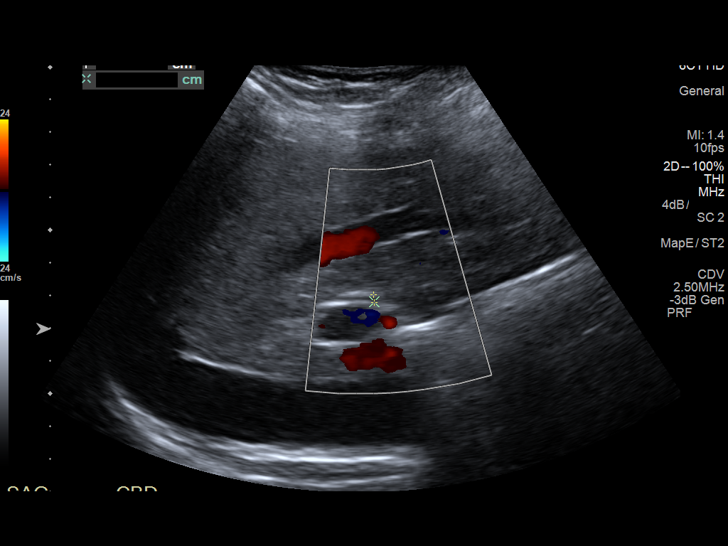
[im 17/44]
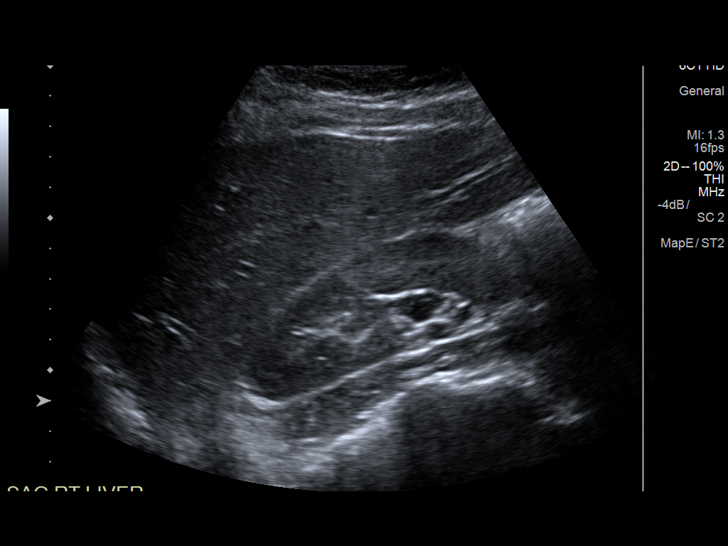
[im 20/44]
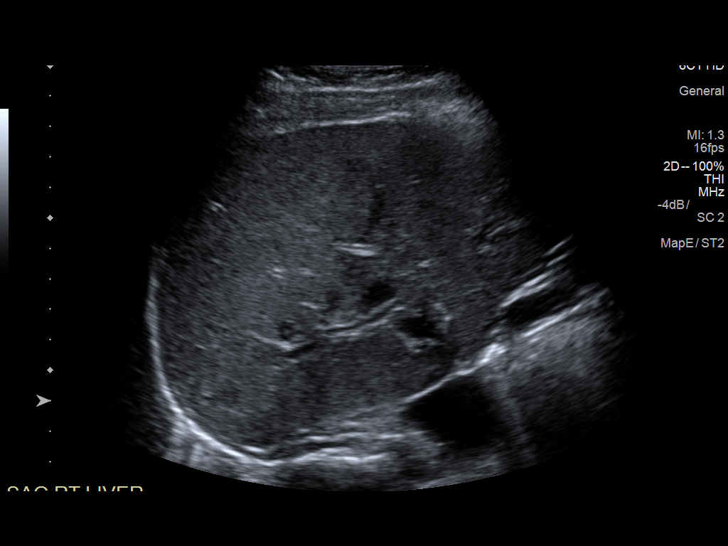
[im 24/44]
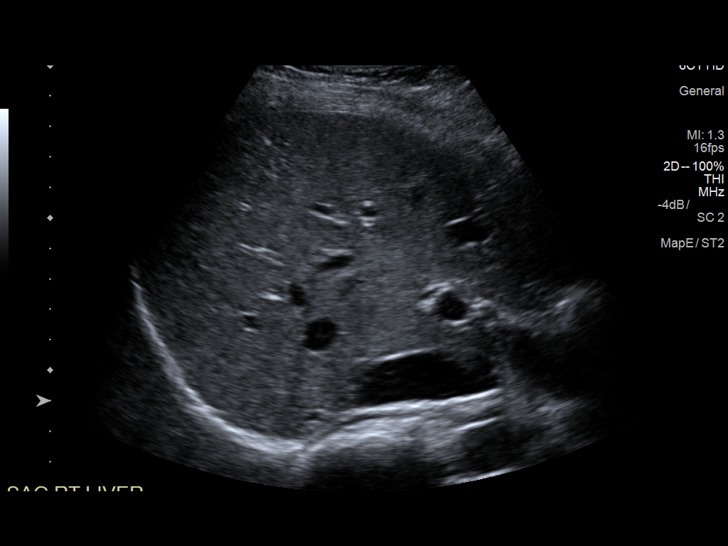
[im 27/44]
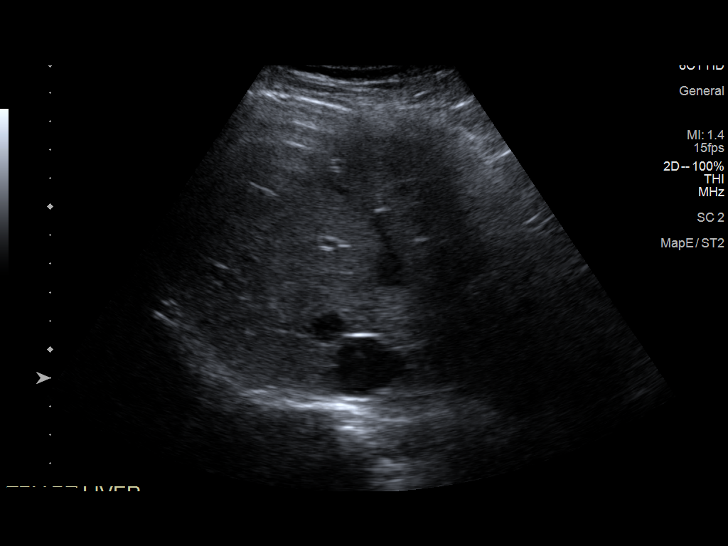
[im 29/44]
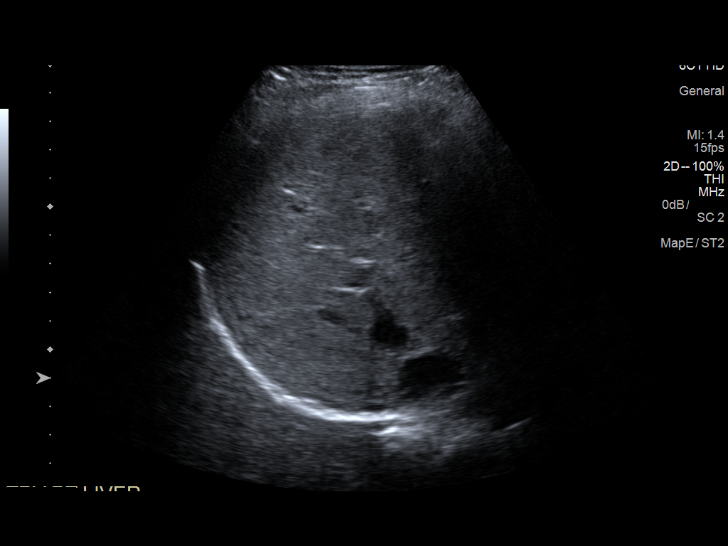
[im 33/44]
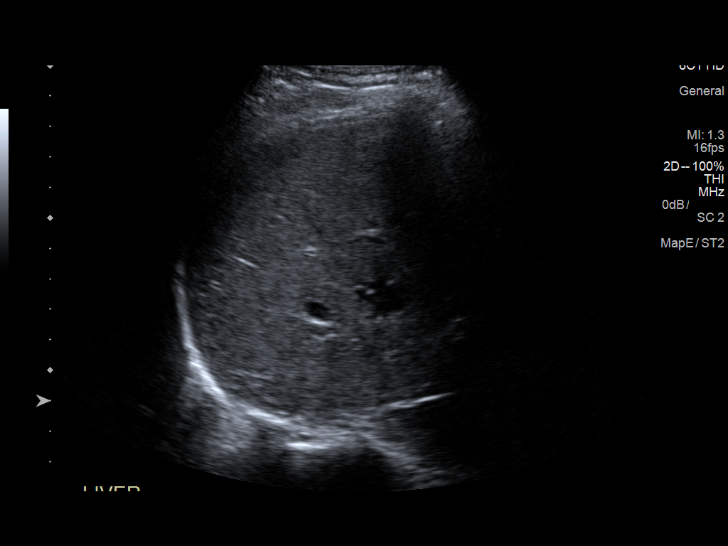
[im 36/44]
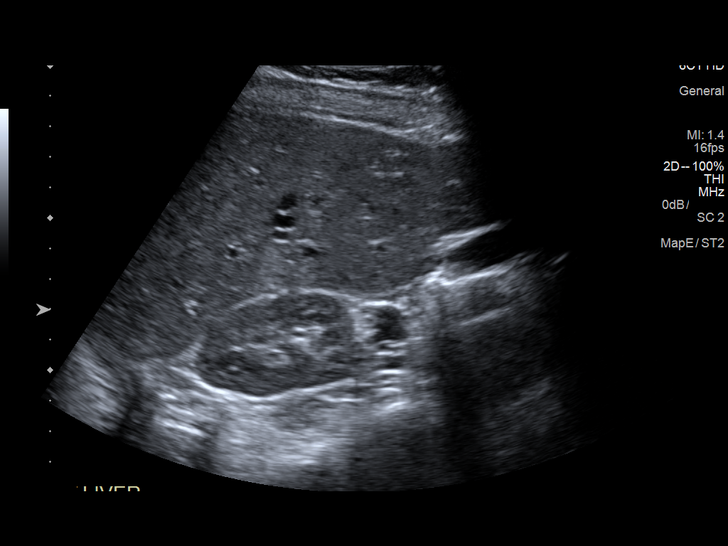
[im 40/44]
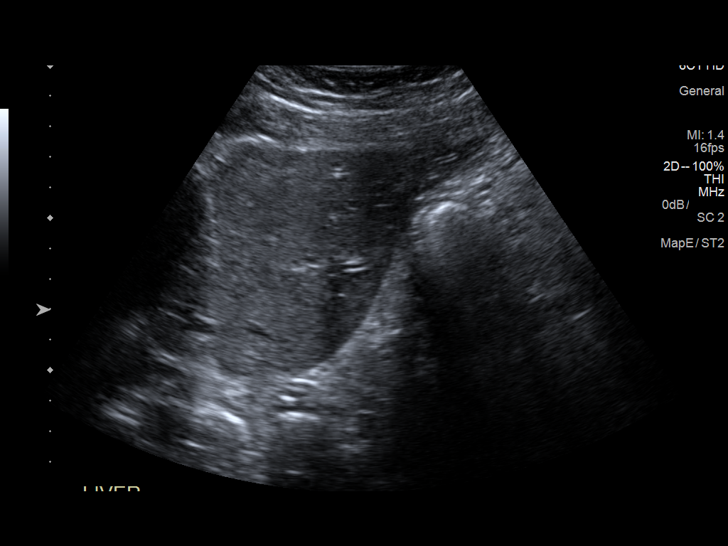
[im 44/44]
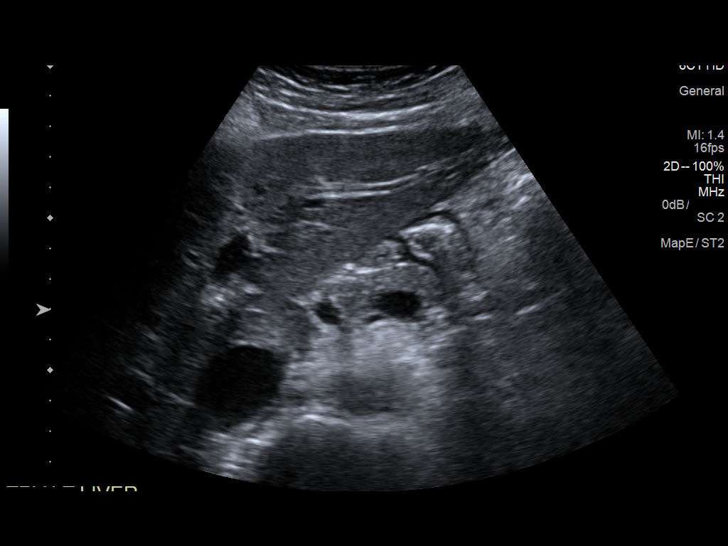

[14 of 25 positions shown; findings below may reference images not displayed]

FINDINGS: Gallbladder:

No gallstones or wall thickening visualized. No sonographic Murphy
sign noted by sonographer.

Common bile duct:

Diameter: 2 mm

Liver:

No focal lesion identified. Within normal limits in parenchymal
echogenicity.
IMPRESSION: Unremarkable right upper quadrant ultrasound.

## 2019-02-10 ENCOUNTER — Emergency Department (HOSPITAL_COMMUNITY)
Admission: EM | Admit: 2019-02-10 | Discharge: 2019-02-10 | Disposition: A | Discharge status: Home / Self Care | Payer: Commercial Managed Care - PPO | Attending: Emergency Medicine | Admitting: Emergency Medicine

## 2019-02-10 ENCOUNTER — Emergency Department (HOSPITAL_COMMUNITY): Payer: Commercial Managed Care - PPO

## 2019-02-10 ENCOUNTER — Other Ambulatory Visit: Payer: Self-pay

## 2019-02-10 ENCOUNTER — Encounter (HOSPITAL_COMMUNITY): Payer: Self-pay | Admitting: Student

## 2019-02-10 DIAGNOSIS — R079 Chest pain, unspecified: Secondary | ICD-10-CM

## 2019-02-10 DIAGNOSIS — Z79899 Other long term (current) drug therapy: Secondary | ICD-10-CM | POA: Diagnosis not present

## 2019-02-10 HISTORY — DX: Other pulmonary embolism without acute cor pulmonale: I26.99

## 2019-02-10 LAB — LIPASE, BLOOD: Lipase: 30 U/L (ref 11–51)

## 2019-02-10 LAB — URINALYSIS, ROUTINE W REFLEX MICROSCOPIC
Bilirubin Urine: NEGATIVE
Glucose, UA: NEGATIVE mg/dL
Hgb urine dipstick: NEGATIVE
KETONES UR: NEGATIVE mg/dL
Leukocytes,Ua: NEGATIVE
Nitrite: NEGATIVE
Protein, ur: NEGATIVE mg/dL
Specific Gravity, Urine: 1.032 — ABNORMAL HIGH (ref 1.005–1.030)
pH: 6 (ref 5.0–8.0)

## 2019-02-10 LAB — COMPREHENSIVE METABOLIC PANEL
ALT: 15 U/L (ref 0–44)
AST: 18 U/L (ref 15–41)
Albumin: 4.4 g/dL (ref 3.5–5.0)
Alkaline Phosphatase: 39 U/L (ref 38–126)
Anion gap: 9 (ref 5–15)
BUN: 16 mg/dL (ref 6–20)
CO2: 24 mmol/L (ref 22–32)
Calcium: 9.5 mg/dL (ref 8.9–10.3)
Chloride: 105 mmol/L (ref 98–111)
Creatinine, Ser: 0.8 mg/dL (ref 0.44–1.00)
GFR calc Af Amer: >60 mL/min (ref >60)
GFR calc non Af Amer: >60 mL/min (ref >60)
Glucose, Bld: 92 mg/dL (ref 70–99)
Potassium: 4.2 mmol/L (ref 3.5–5.1)
Sodium: 138 mmol/L (ref 135–145)
TOTAL PROTEIN: 7.2 g/dL (ref 6.5–8.1)
Total Bilirubin: 0.8 mg/dL (ref 0.3–1.2)

## 2019-02-10 LAB — CBC
HCT: 42.4 % (ref 36.0–46.0)
HEMOGLOBIN: 13.5 g/dL (ref 12.0–15.0)
MCH: 30.1 pg (ref 26.0–34.0)
MCHC: 31.8 g/dL (ref 30.0–36.0)
MCV: 94.4 fL (ref 80.0–100.0)
Platelets: 218 10*3/uL (ref 150–400)
RBC: 4.49 MIL/uL (ref 3.87–5.11)
RDW: 12.1 % (ref 11.5–15.5)
WBC: 6.1 10*3/uL (ref 4.0–10.5)
nRBC: 0 % (ref 0.0–0.2)

## 2019-02-10 LAB — D-DIMER, QUANTITATIVE: D-Dimer, Quant: <0.27 ug/mL-FEU (ref 0.00–0.50)

## 2019-02-10 LAB — I-STAT TROPONIN, ED
Troponin i, poc: 0 ng/mL (ref 0.00–0.08)
Troponin i, poc: 0 ng/mL (ref 0.00–0.08)

## 2019-02-10 LAB — I-STAT BETA HCG BLOOD, ED (MC, WL, AP ONLY): I-stat hCG, quantitative: <5 m[IU]/mL (ref <5)

## 2019-02-10 MED ORDER — LIDOCAINE VISCOUS HCL 2 % MT SOLN
15.0000 mL | Freq: Once | OROMUCOSAL | Status: AC
  Administered 2019-02-10: 15 mL via ORAL
  Filled 2019-02-10: qty 15

## 2019-02-10 MED ORDER — ONDANSETRON HCL 4 MG/2ML IJ SOLN
4.0000 mg | Freq: Once | INTRAMUSCULAR | Status: AC
  Administered 2019-02-10: 4 mg via INTRAVENOUS
  Filled 2019-02-10: qty 2

## 2019-02-10 MED ORDER — ALUM & MAG HYDROXIDE-SIMETH 200-200-20 MG/5ML PO SUSP
30.0000 mL | Freq: Once | ORAL | Status: AC
  Administered 2019-02-10: 30 mL via ORAL
  Filled 2019-02-10: qty 30

## 2019-02-10 NOTE — ED Notes (Signed)
Pt. Refused wheelchair 

## 2019-02-10 NOTE — ED Notes (Signed)
Patient verbalizes understanding of discharge instructions. Opportunity for questioning and answers were provided. Pt discharged from ED. 

## 2019-02-10 NOTE — ED Provider Notes (Signed)
[facility] EMERGENCY DEPARTMENT Provider Note   CSN: [id number] Arrival date & time: 02/10/19  1106    History   Chief Complaint Chief Complaint  Patient presents with  . Chest Pain    HPI Victoria Sanders is a 23 y.o. female with a hx of depression and prior pulmonary embolism during pregnancy no longer on anticoagulation who presents to the ER from UC for evaluation of chest pain that started at 9:45AM this morning. Patient states that she began feeling nauseous after breakfast with 1 episode of emesis and multiple episodes of dry heaving while at work this AM. She states throughout dry heaving she began to have chest discomfort described as tightness in the substernal region with associated mild dyspnea. She states sxs are worse when she is dry heaving or moving around, no alleviating factors. No intervention PTA. She notes sxs initially reminded her for her sxs w/ PE, but now seemed to have improved somewhat. Denies lightheadedness, dizziness, syncope, diaphoresis, leg pain/swelling, hemoptysis, recent surgery/trauma, recent long travel, OCP use (has IUD in place), or personal hx of cancer. Denies hematemesis, abdominal pain, diarrhea, hematochezia, melena, or dysuria. Denies chance of current pregnancy.          HPI  Past Medical History:  Diagnosis Date  . Depression     Patient Active Problem List   Diagnosis Date Noted  . Acute pulmonary embolism (HCC) 07/30/2017  . [redacted] weeks gestation of pregnancy 07/30/2017  . Suicidal ideations   . 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 NO PAST SURGERIES       OB History    Gravida  1   Para  0   Term  0   Preterm  0   AB  0   Living  0     SAB  0   TAB  0   Ectopic  0   Multiple  0   Live Births               Home Medications    Prior to Admission medications   Medication Sig  Start Date End Date Taking? Authorizing Provider  amoxicillin (AMOXIL) 500 MG capsule Take 1 capsule (500 mg total) by mouth 3 (three) times daily. 05/04/18   Horton, Mayer Masker, MD  busPIRone (BUSPAR) 10 MG tablet Take 10 mg by mouth 2 (two) times daily. 06/09/17   [provider]  fluticasone (FLONASE) 50 MCG/ACT nasal spray Place 2 sprays into both nostrils daily. 05/04/18   Horton, Mayer Masker, MD  naproxen (NAPROSYN) 500 MG tablet Take 1 tablet (500 mg total) by mouth 2 (two) times daily. 05/04/18   Horton, Mayer Masker, MD  Prenatal Vit-Fe Fumarate-FA (PRENATAL MULTIVITAMIN) TABS tablet Take 1 tablet by mouth daily at 12 noon.    [provider]  propranolol (INDERAL) 10 MG tablet Take 10 mg by mouth as needed (for anxiety).     [provider]  sertraline (ZOLOFT) 50 MG tablet Take 75 mg by mouth daily.    [provider]    Family History Family History  Problem Relation Age of Onset  . Diabetes Maternal Uncle   . Stroke Maternal Uncle   . Cancer Paternal Grandmother        BRAIN TUMOR   . Diabetes Paternal Grandmother   . Cancer Paternal Grandfather        LUNG  Social History Social History   Tobacco Use  . Smoking status: Never Smoker  . Smokeless tobacco: Never Used  Substance Use Topics  . Alcohol use: No    Alcohol/week: 0.0 standard drinks  . Drug use: No     Allergies   Patient has no known allergies.   Review of Systems Review of Systems  Constitutional: Negative for chills and fever.  HENT: Negative for congestion, ear pain and sore throat.   Respiratory: Positive for shortness of breath. Negative for cough.   Cardiovascular: Positive for chest pain. Negative for palpitations and leg swelling.  Gastrointestinal: Positive for nausea and vomiting. Negative for abdominal pain, blood in stool, constipation and diarrhea.  Genitourinary: Negative for dysuria.  Neurological: Negative for dizziness, syncope, weakness,  light-headedness and numbness.  All other systems reviewed and are negative.    Physical Exam Updated Vital Signs BP 128/77 (BP Location: Right Arm)   Pulse 69   Temp 98.5 F (36.9 C) (Oral)   Resp 18   SpO2 100%   Physical Exam Vitals signs and nursing note reviewed.  Constitutional:      General: She is not in acute distress.    Appearance: She is well-developed. She is not toxic-appearing.  HENT:     Head: Normocephalic and atraumatic.  Eyes:     General:        Right eye: No discharge.        Left eye: No discharge.     Conjunctiva/sclera: Conjunctivae normal.  Neck:     Musculoskeletal: Neck supple.  Cardiovascular:     Rate and Rhythm: Normal rate and regular rhythm.     Pulses:          Radial pulses are 2+ on the right side and 2+ on the left side.     Heart sounds: No murmur. No friction rub.  Pulmonary:     Effort: Pulmonary effort is normal. No respiratory distress.     Breath sounds: Normal breath sounds. No wheezing, rhonchi or rales.  Chest:     Chest wall: No tenderness.  Abdominal:     General: There is no distension.     Palpations: Abdomen is soft.     Tenderness: There is no abdominal tenderness. There is no guarding or rebound.     Comments: Negative Murphys  Musculoskeletal:     Right lower leg: She exhibits no tenderness. No edema.     Left lower leg: She exhibits no tenderness. No edema.  Skin:    General: Skin is warm and dry.     Findings: No rash.  Neurological:     Mental Status: She is alert.     Comments: Clear speech.   Psychiatric:        Behavior: Behavior normal.      ED Treatments / Results  Labs (all labs ordered are listed, but only abnormal results are displayed) Labs Reviewed  URINALYSIS, ROUTINE W REFLEX MICROSCOPIC - Abnormal; Notable for the following components:      Result Value   Specific Gravity, Urine 1.032 (*)    All other components within normal limits  CBC  D-DIMER, QUANTITATIVE (NOT AT [facility])    COMPREHENSIVE METABOLIC PANEL  LIPASE, BLOOD  I-STAT TROPONIN, ED  I-STAT BETA HCG BLOOD, ED ([facility], [facility], [facility] ONLY)  I-STAT TROPONIN, ED    EKG EKG Interpretation  Date/Time:  Saturday February 10 2019 11:20:11 EST Ventricular Rate:  70 PR Interval:    QRS Duration: 81 QT  Interval:  395 QTC Calculation: 427 R Axis:   73 Text Interpretation:  Sinus rhythm Confirmed by Bethann Berkshire [phone]) on 02/10/2019 1:22:18 PM   Radiology Dg Chest 2 View  Result Date: 02/10/2019 CLINICAL DATA:  Chest tightness.  History of pulmonary embolism. EXAM: CHEST - 2 VIEW COMPARISON:  07/29/2017 FINDINGS: The heart size and mediastinal contours are within normal limits. Both lungs are clear. The visualized skeletal structures are unremarkable. IMPRESSION: No active cardiopulmonary disease. Electronically Signed   By: Marlan Palau M.D.   On: 02/10/2019 12:43    Procedures Procedures (including critical care time)  Medications Ordered in ED Medications  ondansetron (ZOFRAN) injection 4 mg (4 mg Intravenous Given 02/10/19 1236)  alum & mag hydroxide-simeth (MAALOX/MYLANTA) 200-200-20 MG/5ML suspension 30 mL (30 mLs Oral Given 02/10/19 1236)    And  lidocaine (XYLOCAINE) 2 % viscous mouth solution 15 mL (15 mLs Oral Given 02/10/19 1236)     Initial Impression / Assessment and Plan / ED Course  I have reviewed the triage vital signs and the nursing notes.  Pertinent labs & imaging results that were available during my care of the patient were reviewed by me and considered in my medical decision making (see chart for details).    Patient presents to the emergency department with chest pain, has had some nausea with emesis this AM as well. Patient nontoxic appearing, in no apparent distress, vitals without significant abnormality. Fairly benign physical exam. Heart RRR. Lungs CTA. No abdominal tenderness or peritoneal signs. No clinical signs of DVT. Evaluation initiated with labs, EKG, and CXR. Patient on  cardiac monitor.   Work-up in the ER unremarkable CBC: No leukocytosis or anemia CMP: No electrolyte derangement. LFTs & renal function preserved.  Lipase: WNL UA: no UTI, mildly dehydrated w/ elevated specific gravity D-dimer: Negative Delta trop: Negative  Low risk heart score, EKG without obvious ischemia, delta troponin negative, doubt ACS. Patient is low risk wells, not PERC negative given prior PE, D-dimer negative, not hypoxic or tachycardic, prior PE was with pregnancy, doubt pulmonary embolism. Pain is not a tearing sensation, symmetric pulses, no widening of mediastinum on CXR, doubt dissection. LFTs/lipase WNL. No abdominal tenderness. Doubt cholecystitis, pancreatitis, obstruction, perforation, appendicitis. Tolerating PO without difficulty. Cardiac monitor reviewed, no notable arrhythmias or tachycardia. Patient has appeared hemodynamically stable throughout ER visit and appears safe for discharge with close PCP follow up. I discussed results, treatment plan, need for PCP follow-up, and return precautions with the patient. Provided opportunity for questions, patient confirmed understanding and is in agreement with plan.   Findings and plan of care discussed with supervising physician Dr. Estell Harpin who is in agreement.    Final Clinical Impressions(s) / ED Diagnoses   Final diagnoses:  Chest pain, unspecified type    ED Discharge Orders    None       Cherly Anderson, PA-C 02/10/19 1445    Bethann Berkshire, MD 02/11/19 [phone]

## 2019-02-10 NOTE — Discharge Instructions (Signed)
You were seen in the emergency department today for chest pain. Your work-up in the emergency department has been overall reassuring. Your labs have been fairly normal and or similar to previous blood work you have had done. Your EKG and the enzyme we use to check your heart did not show an acute heart attack at this time. Your chest x-ray was normal. The lab we use to check for a blood clot was normal.   We would like you to follow up closely with your primary care provider and/or the cardiologist provided in your discharge instructions within 1-3 days. Return to the ER immediately should you experience any new or worsening symptoms including but not limited to return of pain, worsened pain, vomiting, shortness of breath, dizziness, lightheadedness, passing out, or any other concerns that you may have.

## 2019-02-10 NOTE — ED Notes (Signed)
Patient transported to x-ray. ?

## 2019-02-10 NOTE — ED Triage Notes (Signed)
Pt reports central chest tightness that started this morning, worse with movement. Pt also had some nausea this morning and SOB on exertion. Pt a.o, nad noted

## 2019-03-19 IMAGING — CR DG CHEST 2V
2 series · 2 of 2 positions shown · non-contrast
Comparison: None.

CLINICAL DATA: Acute onset chest pain and syncopal episode today.
Shortness of breath. Thirty-two weeks pregnant.

EXAM:
CHEST  2 VIEW

[chest pa]
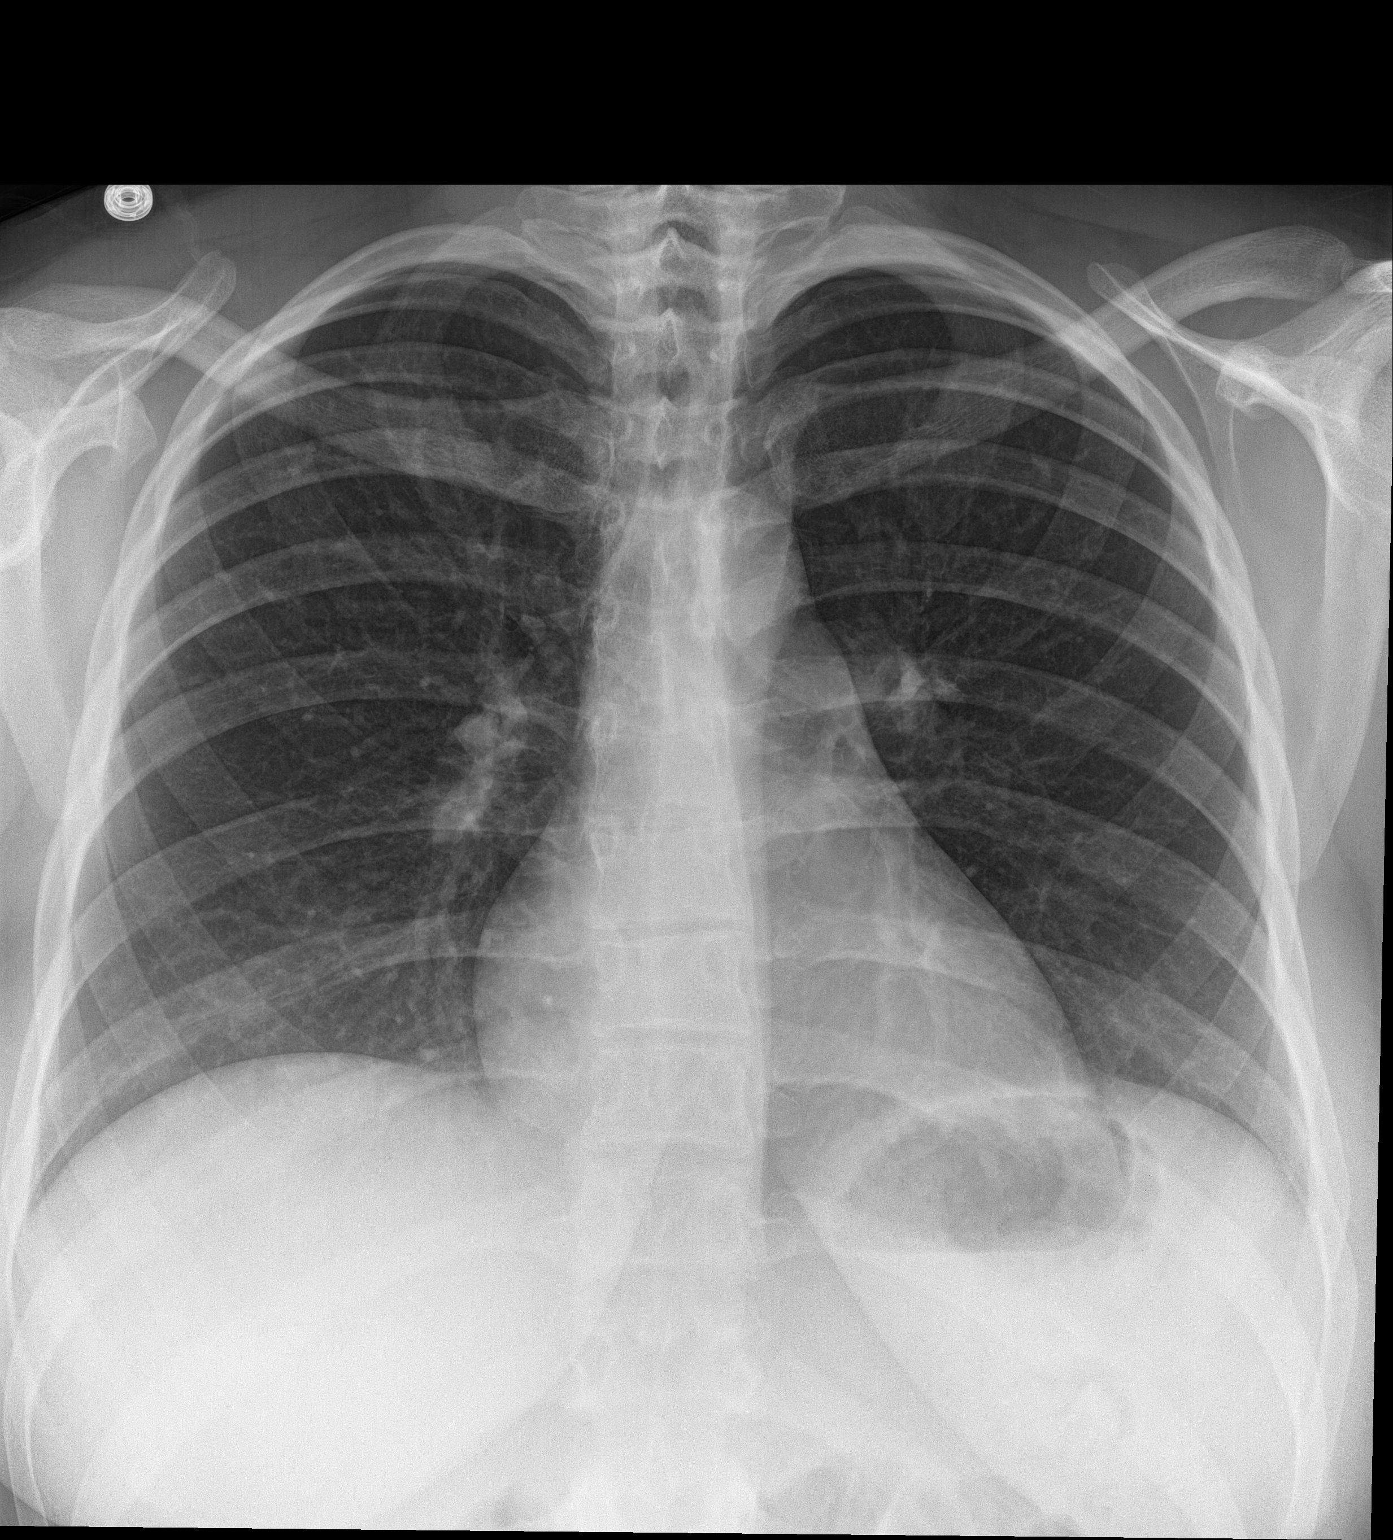

[chest lat]
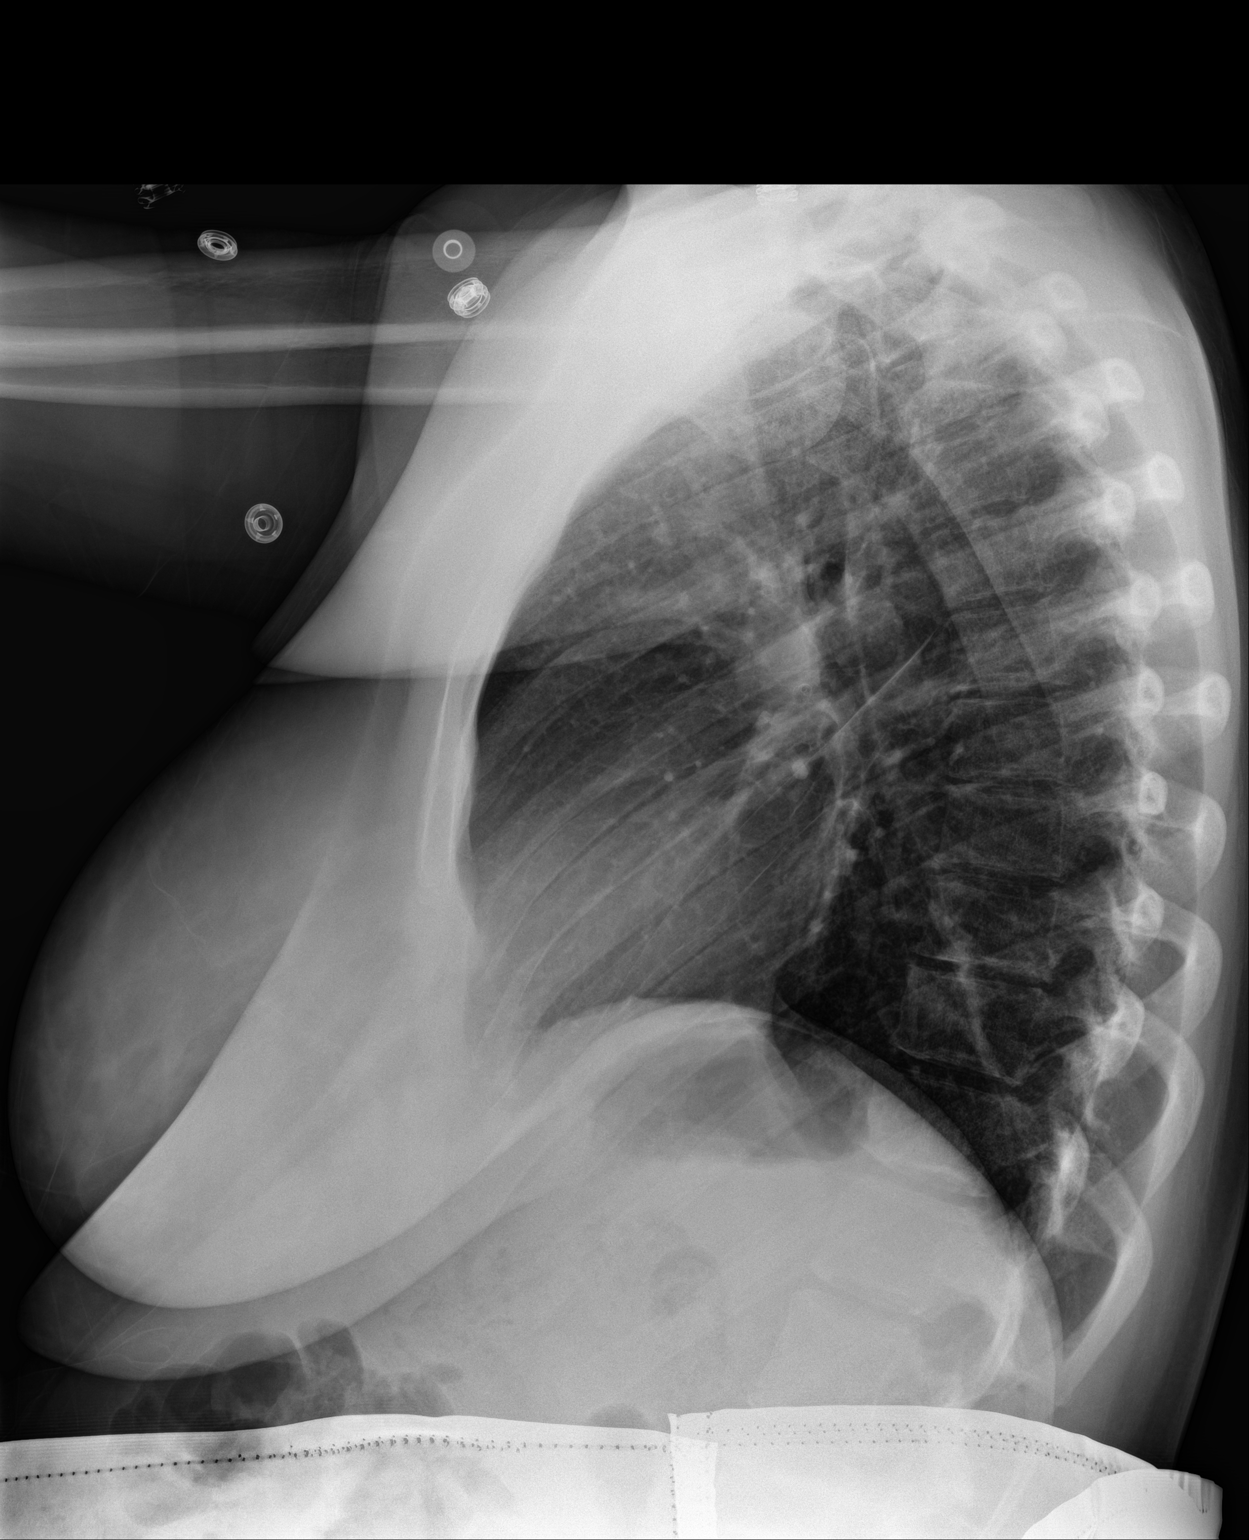

[2 of 2 positions shown; findings below may reference images not displayed]

FINDINGS: The heart size and mediastinal contours are within normal limits.
Both lungs are clear. No evidence of pneumothorax or pleural
effusion. The visualized skeletal structures are unremarkable.
IMPRESSION: No active cardiopulmonary disease.

## 2020-12-05 ENCOUNTER — Encounter (HOSPITAL_COMMUNITY): Payer: Self-pay | Admitting: Emergency Medicine

## 2020-12-05 ENCOUNTER — Emergency Department (HOSPITAL_COMMUNITY)
Admission: EM | Admit: 2020-12-05 | Discharge: 2020-12-06 | Disposition: A | Discharge status: Home / Self Care | Payer: Commercial Managed Care - PPO | Attending: Emergency Medicine | Admitting: Emergency Medicine

## 2020-12-05 ENCOUNTER — Other Ambulatory Visit: Payer: Self-pay

## 2020-12-05 ENCOUNTER — Ambulatory Visit (HOSPITAL_COMMUNITY)
Admission: EM | Admit: 2020-12-05 | Discharge: 2020-12-05 | Disposition: A | Discharge status: Home / Self Care | Payer: Commercial Managed Care - PPO

## 2020-12-05 DIAGNOSIS — W208XXA Other cause of strike by thrown, projected or falling object, initial encounter: Secondary | ICD-10-CM | POA: Insufficient documentation

## 2020-12-05 DIAGNOSIS — Y92195 Garage of other specified residential institution as the place of occurrence of the external cause: Secondary | ICD-10-CM | POA: Insufficient documentation

## 2020-12-05 DIAGNOSIS — Z79899 Other long term (current) drug therapy: Secondary | ICD-10-CM | POA: Diagnosis not present

## 2020-12-05 DIAGNOSIS — S0990XA Unspecified injury of head, initial encounter: Secondary | ICD-10-CM | POA: Diagnosis not present

## 2020-12-05 NOTE — ED Notes (Signed)
Patient is being discharged from the Urgent Care and sent to the Emergency Department via POV . Per Wendee Beavers, NP, patient is in need of higher level of care due to head injury and vision changes. Patient is aware and verbalizes understanding of plan of care.  Vitals:   12/05/20 1728  BP: 129/79  Pulse: 63  Resp: 16  Temp: 98.1 F (36.7 C)  SpO2: 100%

## 2020-12-05 NOTE — ED Triage Notes (Signed)
Pt presents to ED POV. Pt c/o headache 8/10. Pt reports that she had door fall on head. Pt denies LOC.

## 2020-12-05 NOTE — ED Notes (Signed)
Also reports  Neck pain at base of skull

## 2020-12-05 NOTE — ED Triage Notes (Signed)
PT reports a storage building door struck her on the top of her head on left side. PT denies LOC. Reports a "pressure" type pain over site of injury. States some sensitivity to light in left eye. Slightly blurred vision in left eye. Denies nausea, dizziness, double vision.

## 2020-12-06 ENCOUNTER — Emergency Department (HOSPITAL_COMMUNITY): Payer: Commercial Managed Care - PPO

## 2020-12-06 DIAGNOSIS — S0990XA Unspecified injury of head, initial encounter: Secondary | ICD-10-CM | POA: Diagnosis not present

## 2020-12-06 NOTE — ED Notes (Signed)
Patient transported to CT 

## 2020-12-06 NOTE — ED Provider Notes (Signed)
[facility] EMERGENCY DEPARTMENT Provider Note   CSN: [id number] Arrival date & time: 12/05/20  1812     History Chief Complaint  Patient presents with  . Head Injury    Victoria Sanders is a 24 y.o. female.  Victoria Sanders is a 24 y.o. female with a history of depression and PE, who presents to the ED for evaluation after head injury.  She reports that last night she had a garage door from a storage unit fall and hit her on the left side of the head.  She did not have any laceration or wound.  No loss of consciousness.  Reports persistent headache and that she has had some intermittent blurred vision in the left eye.  No nausea or vomiting.  No dizziness.  Denies any numbness tingling or weakness.  No recent head injuries prior to this incident.  She does have some pain at the base of the head but no midline neck tenderness or decreased range of motion.  Reports that she has been to 3 different urgent cares and was ultimately sent here and told that she would likely need a CT scan.  She has not had anything for headache.  No other injuries from the garage door.  No other aggravating or alleviating factors.        Past Medical History:  Diagnosis Date  . Depression   . Pulmonary embolism (HCC)    during pregnancy    Patient Active Problem List   Diagnosis Date Noted  . Acute pulmonary embolism (HCC) 07/30/2017  . [redacted] weeks gestation of pregnancy 07/30/2017  . Suicidal ideations   . 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 NO PAST SURGERIES       OB History    Gravida  1   Para  0   Term  0   Preterm  0   AB  0   Living  0     SAB  0   IAB  0   Ectopic  0   Multiple  0   Live Births              Family History  Problem Relation Age of Onset  . Diabetes Maternal Uncle   . Stroke Maternal Uncle   . Cancer Paternal  Grandmother        BRAIN TUMOR   . Diabetes Paternal Grandmother   . Cancer Paternal Grandfather        LUNG     Social History   Tobacco Use  . Smoking status: Never Smoker  . Smokeless tobacco: Never Used  Substance Use Topics  . Alcohol use: No    Alcohol/week: 0.0 standard drinks  . Drug use: No    Home Medications Prior to Admission medications   Medication Sig Start Date End Date Taking? Authorizing Provider  amoxicillin (AMOXIL) 500 MG capsule Take 1 capsule (500 mg total) by mouth 3 (three) times daily. 05/04/18   Horton, Mayer Masker, MD  busPIRone (BUSPAR) 10 MG tablet Take 10 mg by mouth 2 (two) times daily. 06/09/17   [provider]  desvenlafaxine (PRISTIQ) 50 MG 24 hr tablet Take 50 mg by mouth daily.    [provider]  fluticasone (FLONASE) 50 MCG/ACT nasal spray Place 2 sprays into both nostrils daily. 05/04/18   Horton, Mayer Masker, MD  naproxen (NAPROSYN) 500 MG tablet Take 1  tablet (500 mg total) by mouth 2 (two) times daily. 05/04/18   Horton, Mayer Masker, MD  Prenatal Vit-Fe Fumarate-FA (PRENATAL MULTIVITAMIN) TABS tablet Take 1 tablet by mouth daily at 12 noon.    [provider]  propranolol (INDERAL) 10 MG tablet Take 10 mg by mouth as needed (for anxiety).     [provider]  sertraline (ZOLOFT) 50 MG tablet Take 75 mg by mouth daily.    [provider]  spironolactone (ALDACTONE) 100 MG tablet Take 100 mg by mouth daily.    [provider]  traZODone (DESYREL) 50 MG tablet Take 50 mg by mouth at bedtime.    [provider]    Allergies    Patient has no known allergies.  Review of Systems   Review of Systems  Constitutional: Negative for chills and fever.  Eyes: Positive for visual disturbance. Negative for pain.  Respiratory: Negative for cough and shortness of breath.   Cardiovascular: Negative for chest pain.  Gastrointestinal: Negative for nausea and vomiting.  Musculoskeletal: Negative  for arthralgias, back pain, myalgias and neck pain.  Neurological: Positive for headaches. Negative for dizziness, seizures, syncope, facial asymmetry, weakness, light-headedness and numbness.  All other systems reviewed and are negative.   Physical Exam Updated Vital Signs BP 111/71 (BP Location: Left Arm)   Pulse 71   Temp 98.3 F (36.8 C) (Oral)   Resp 18   Ht 5\' 7"  (1.702 m)   Wt 77.1 kg   SpO2 100%   BMI 26.63 kg/m   Physical Exam Vitals and nursing note reviewed.  Constitutional:      General: She is not in acute distress.    Appearance: Normal appearance. She is well-developed, normal weight and well-nourished. She is not ill-appearing or diaphoretic.     Comments: Well-appearing and in no distress  HENT:     Head: Normocephalic.     Comments: Tenderness over the left parietal scalp with small palpable hematoma, no lacerations, no step-off or deformity or other signs of head trauma, negative battle sign    Mouth/Throat:     Mouth: Mucous membranes are moist.     Pharynx: Oropharynx is clear.  Eyes:     General:        Right eye: No discharge.        Left eye: No discharge.     Extraocular Movements: Extraocular movements intact.     Pupils: Pupils are equal, round, and reactive to light.  Neck:     Comments: No midline C-spine tenderness, full range of motion, she has some mild pain on the left paraspinal muscles at the base of the skull but no palpable deformity Pulmonary:     Effort: Pulmonary effort is normal. No respiratory distress.  Musculoskeletal:        General: No deformity.     Cervical back: Normal range of motion and neck supple. No tenderness.  Skin:    General: Skin is warm and dry.  Neurological:     Mental Status: She is alert and oriented to person, place, and time.     Coordination: Coordination normal.     Comments: Speech is clear, able to follow commands CN III-XII intact Normal strength in upper and lower extremities bilaterally  including dorsiflexion and plantar flexion, strong and equal grip strength Sensation normal to light and sharp touch Moves extremities without ataxia, coordination intact No pronator drift  Psychiatric:        Mood and Affect: Mood and  affect and mood normal.        Behavior: Behavior normal.     ED Results / Procedures / Treatments   Labs (all labs ordered are listed, but only abnormal results are displayed) Labs Reviewed - No data to display  EKG None  Radiology CT Head Wo Contrast  Result Date: 12/06/2020 CLINICAL DATA:  Moderate to severe head trauma. Patient was hit in the right side of the head. No laceration. EXAM: CT HEAD WITHOUT CONTRAST TECHNIQUE: Contiguous axial images were obtained from the base of the skull through the vertex without intravenous contrast. COMPARISON:  None. FINDINGS: Brain: No evidence of acute infarction, hemorrhage, hydrocephalus, extra-axial collection or mass lesion/mass effect. Vascular: No hyperdense vessel or unexpected calcification. Skull: Normal. Negative for fracture or focal lesion. Sinuses/Orbits: No acute finding. Other: None. IMPRESSION: No abnormalities are identified. Electronically Signed   By: Gerome Sam III M.D   On: 12/06/2020 08:44    Procedures Procedures (including critical care time)  Medications Ordered in ED Medications - No data to display  ED Course  I have reviewed the triage vital signs and the nursing notes.  Pertinent labs & imaging results that were available during my care of the patient were reviewed by me and considered in my medical decision making (see chart for details).    MDM Rules/Calculators/A&P                          24 year old female presents after head injury last night, she was struck in the head with a garage door from a storage unit, no associated laceration, no LOC but persistent headache with some intermittent blurred vision, no dizziness, vomiting, no significant deformity over skull.   Went to 3 different urgent cares and was told she needed to come here for a head CT.  She has no focal deficits on exam.  Patient is very concerned, will get CT of the head, she has some left-sided pain at the base of the skull and neck but no midline C-spine tenderness and has full range of motion, I do not feel there is the need for C-spine imaging.  CT of the head is unremarkable.  Discussed reassuring results with patient.  Discussed treatment with Tylenol for headaches, she may have mild concussion, discussed precautions and home care as well as return precautions.  She expresses understanding and agreement.  Discharged home in good condition.  Final Clinical Impression(s) / ED Diagnoses Final diagnoses:  Injury of head, initial encounter    Rx / DC Orders ED Discharge Orders    None       Legrand Rams 12/06/20 6834    Jacalyn Lefevre, MD 12/06/20 [phone]

## 2020-12-06 NOTE — Discharge Instructions (Signed)
You were examined today for a head injury and possible concussion. Your head CT showed no evidence of  Injury today.   Sometimes serious problems can develop after a head injury. Please return to the emergency department if you experience any of the following symptoms: Repeated vomiting Headache that gets worse and does not go away Loss of consciousness or inability to stay awake at times when you normally would be able to Getting more confused, restless or agitated Convulsions or seizures Difficulty walking or feeling off balance Weakness or numbness Vision changes A concussion is a very mild traumatic brain injury caused by a bump, jolt or blow to the head, most people recover quickly and fully. You can experience a wide variety of symptoms including:   - Confusion      - Difficulty concentrating       - Trouble remembering new info  - Headache      - Dizziness        - Fuzzy or blurry vision  - Fatigue      - Balance problems      - Light sensitivity  - Mood swings     - Changes in sleep or difficulty sleeping   To help these symptoms improve make sure you are getting plenty of rest, avoid screen time, loud music and strenuous mental activities. Avoid any strenuous physical activities, once your symptoms have resolved a slow and gradual return to activity is recommended. It is very important that you avoid situations in which you might sustain a second head injury as this can be very dangerous and life threatening. You cannot be medically cleared to return to normal activities until you have followed up with your primary doctor or a concussion specialist for reevaluation if symptoms have not completely resolved.

## 2021-01-19 ENCOUNTER — Other Ambulatory Visit: Payer: Self-pay | Admitting: Physician Assistant

## 2021-01-19 ENCOUNTER — Other Ambulatory Visit: Payer: Self-pay | Admitting: Gastroenterology

## 2021-01-19 DIAGNOSIS — N63 Unspecified lump in unspecified breast: Secondary | ICD-10-CM

## 2021-02-16 ENCOUNTER — Other Ambulatory Visit: Payer: Self-pay

## 2021-02-16 ENCOUNTER — Ambulatory Visit
Admission: RE | Admit: 2021-02-16 | Discharge: 2021-02-16 | Disposition: A | Discharge status: Home / Self Care | Payer: Commercial Managed Care - PPO | Source: Ambulatory Visit | Attending: Physician Assistant | Admitting: Physician Assistant

## 2021-02-16 DIAGNOSIS — N63 Unspecified lump in unspecified breast: Secondary | ICD-10-CM

## 2021-05-22 ENCOUNTER — Emergency Department (HOSPITAL_COMMUNITY)
Admission: EM | Admit: 2021-05-22 | Discharge: 2021-05-22 | Disposition: A | Discharge status: Home / Self Care | Payer: No Typology Code available for payment source | Attending: Emergency Medicine | Admitting: Emergency Medicine

## 2021-05-22 ENCOUNTER — Emergency Department (HOSPITAL_COMMUNITY): Payer: No Typology Code available for payment source

## 2021-05-22 ENCOUNTER — Encounter (HOSPITAL_COMMUNITY): Payer: Self-pay

## 2021-05-22 ENCOUNTER — Other Ambulatory Visit: Payer: Self-pay

## 2021-05-22 DIAGNOSIS — T22112A Burn of first degree of left forearm, initial encounter: Secondary | ICD-10-CM

## 2021-05-22 DIAGNOSIS — X16XXXA Contact with hot heating appliances, radiators and pipes, initial encounter: Secondary | ICD-10-CM | POA: Diagnosis not present

## 2021-05-22 DIAGNOSIS — S161XXA Strain of muscle, fascia and tendon at neck level, initial encounter: Secondary | ICD-10-CM | POA: Insufficient documentation

## 2021-05-22 DIAGNOSIS — T23211A Burn of second degree of right thumb (nail), initial encounter: Secondary | ICD-10-CM | POA: Diagnosis not present

## 2021-05-22 DIAGNOSIS — T22212A Burn of second degree of left forearm, initial encounter: Secondary | ICD-10-CM | POA: Insufficient documentation

## 2021-05-22 DIAGNOSIS — Y92410 Unspecified street and highway as the place of occurrence of the external cause: Secondary | ICD-10-CM | POA: Diagnosis not present

## 2021-05-22 DIAGNOSIS — T23111A Burn of first degree of right thumb (nail), initial encounter: Secondary | ICD-10-CM

## 2021-05-22 DIAGNOSIS — S199XXA Unspecified injury of neck, initial encounter: Secondary | ICD-10-CM | POA: Diagnosis present

## 2021-05-22 MED ORDER — LORAZEPAM 2 MG/ML IJ SOLN
2.0000 mg | Freq: Once | INTRAMUSCULAR | Status: DC
  Filled 2021-05-22: qty 1
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
## 2021-05-22 NOTE — Discharge Instructions (Addendum)
Tylenol or ibuprofen for pain.  Return if any problems.  

## 2021-10-17 ENCOUNTER — Other Ambulatory Visit: Payer: Self-pay

## 2021-10-17 ENCOUNTER — Emergency Department (HOSPITAL_COMMUNITY)
Admission: EM | Admit: 2021-10-17 | Discharge: 2021-10-17 | Disposition: A | Discharge status: Home / Self Care | Payer: Medicaid Other | Attending: Student | Admitting: Student

## 2021-10-17 ENCOUNTER — Emergency Department (HOSPITAL_COMMUNITY): Payer: Medicaid Other

## 2021-10-17 ENCOUNTER — Encounter (HOSPITAL_COMMUNITY): Payer: Self-pay | Admitting: Emergency Medicine

## 2021-10-17 DIAGNOSIS — R509 Fever, unspecified: Secondary | ICD-10-CM | POA: Diagnosis present

## 2021-10-17 DIAGNOSIS — J101 Influenza due to other identified influenza virus with other respiratory manifestations: Secondary | ICD-10-CM | POA: Insufficient documentation

## 2021-10-17 DIAGNOSIS — Z20822 Contact with and (suspected) exposure to covid-19: Secondary | ICD-10-CM | POA: Insufficient documentation

## 2021-10-17 DIAGNOSIS — J111 Influenza due to unidentified influenza virus with other respiratory manifestations: Secondary | ICD-10-CM

## 2021-10-17 LAB — RESP PANEL BY RT-PCR (FLU A&B, COVID) ARPGX2
Influenza A by PCR: POSITIVE — AB
Influenza B by PCR: NEGATIVE
SARS Coronavirus 2 by RT PCR: NEGATIVE

## 2021-10-17 MED ORDER — ONDANSETRON 4 MG PO TBDP: 4.0000 mg | ORAL_TABLET | Freq: Three times a day (TID) | ORAL | 0 refills | Status: AC | PRN

## 2021-10-17 NOTE — ED Triage Notes (Signed)
Pt reports fever up to 103, night sweats, dizziness, chills, and body aches since Thursday.  Negative COVID test at PCP office yesterday.  Took Tylenol 975mg  at 0730.

## 2021-10-17 NOTE — Discharge Instructions (Addendum)
You can use tylenol as needed for fever. You should stay out of work until you are 24 hours fever free without the use of tylenol or motrin.  You can stop taking your amoxicillin your chest x-ray does not show evidence of pneumonia.

## 2021-10-17 NOTE — ED Provider Notes (Signed)
Emergency Medicine Provider Triage Evaluation Note  Victoria Sanders , a 25 y.o. female  was evaluated in triage.  Pt complains of fever up to 103 by home thermometer, body aches, chills, and occasional cough since Thursday. 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 She has had a negative covid test. No NVD. She does report some non-exertional chest pain, reports her phlegm clears in hot shower and causes a cough. Symptoms started Thursday, tylenol PTA.  Review of Systems  Positive: As above Negative: As above  Physical Exam  BP 122/75 (BP Location: Right Arm)   Pulse (!) 112   Temp 100.2 F (37.9 C) (Oral)   Resp 16   SpO2 98%  Gen:   Awake, diaphoretic, but no distress Resp:  Normal effort, no wheezing, no focal consolidation noted MSK:   Moves extremities without difficulty Other:    Medical Decision Making  Medically screening exam initiated at 8:31 AM.  Appropriate orders placed.  Neldon Newport Reppucci was informed that the remainder of the evaluation will be completed by another provider, this initial triage assessment does not replace that evaluation, and the importance of remaining in the ED until their evaluation is complete.  Fever x 3 days   [place] 10/17/21 3016    Glendora Score, MD 10/17/21 [phone]

## 2021-10-17 NOTE — ED Provider Notes (Signed)
[facility] EMERGENCY DEPARTMENT Provider Note   CSN: [id number] Arrival date & time: 10/17/21  8144     History Chief Complaint  Patient presents with   Fever   Dizziness    Victoria Sanders is a 25 y.o. female with a PMH significant for prior PE who presents with report of fever up to 103 by home thermometer, body aches, chills, and occasional cough since Thursday. 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 She has had a negative covid test. No NVD. She does report some non-exertional chest pain, reports her phlegm clears in hot shower and causes a cough. Symptoms started Thursday, tylenol PTA.    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    Past Surgical History:  Procedure Laterality Date   NO PAST SURGERIES       OB History     Gravida  1   Para  0   Term  0   Preterm  0   AB  0   Living  0      SAB  0   IAB  0   Ectopic  0   Multiple  0   Live Births              Family History  Problem Relation Age of Onset   Diabetes Maternal Uncle    Stroke Maternal Uncle    Cancer Paternal Grandmother        BRAIN TUMOR    Diabetes Paternal Grandmother    Cancer Paternal Grandfather        LUNG    Healthy Mother     Social History   Tobacco Use   Smoking status: Never   Smokeless tobacco: Never  Vaping Use   Vaping Use: Never used  Substance Use Topics   Alcohol use: No    Alcohol/week: 0.0 standard drinks   Drug use: No    Home Medications Prior to Admission medications   Medication Sig Start Date End Date Taking? Authorizing Provider  ondansetron (ZOFRAN ODT) 4  MG disintegrating tablet Take 1 tablet (4 mg total) by mouth every 8 (eight) hours as needed for nausea or vomiting. 10/17/21  Yes Jaston Havens H, PA-C  amoxicillin (AMOXIL) 500 MG capsule Take 1 capsule (500 mg total) by mouth 3 (three) times daily. 05/04/18   Horton, Mayer Masker, MD  busPIRone (BUSPAR) 10 MG tablet Take 10 mg by mouth 2 (two) times daily. 06/09/17   [provider]  desvenlafaxine (PRISTIQ) 50 MG 24 hr tablet Take 50 mg by mouth daily.    [provider]  fluticasone (FLONASE) 50 MCG/ACT nasal spray Place 2 sprays into both nostrils daily. 05/04/18   Horton, Mayer Masker, MD  naproxen (NAPROSYN) 500 MG tablet Take 1 tablet (500 mg total) by mouth 2 (two) times daily. 05/04/18   Horton, Mayer Masker, MD  Prenatal Vit-Fe Fumarate-FA (PRENATAL MULTIVITAMIN) TABS tablet Take 1 tablet by mouth daily at 12 noon.    [provider]  propranolol (INDERAL) 10 MG tablet Take 10 mg by mouth as needed (for anxiety).  [provider]  sertraline (ZOLOFT) 50 MG tablet Take 75 mg by mouth daily.    [provider]  spironolactone (ALDACTONE) 100 MG tablet Take 100 mg by mouth daily.    [provider]  traZODone (DESYREL) 50 MG tablet Take 50 mg by mouth at bedtime.    [provider]    Allergies    Patient has no known allergies.  Review of Systems   Review of Systems  Constitutional:  Positive for fever.  Neurological:  Positive for dizziness.  All other systems reviewed and are negative.  Physical Exam Updated Vital Signs BP 98/74   Pulse 100   Temp (!) 101.1 F (38.4 C) (Oral)   Resp 20   SpO2 96%   Physical Exam Vitals and nursing note reviewed.  Constitutional:      General: She is not in acute distress.    Appearance: Normal appearance. She is ill-appearing.     Comments: Somewhat ill-appearing woman laying on bed in no acute distress  HENT:     Head: Normocephalic and atraumatic.  Eyes:     General:         Right eye: No discharge.        Left eye: No discharge.  Cardiovascular:     Rate and Rhythm: Regular rhythm. Tachycardia present.     Heart sounds: No murmur heard.   No friction rub. No gallop.  Pulmonary:     Effort: Pulmonary effort is normal.     Breath sounds: Normal breath sounds.  Abdominal:     General: Bowel sounds are normal.     Palpations: Abdomen is soft.     Tenderness: There is no abdominal tenderness.  Skin:    General: Skin is warm and dry.     Capillary Refill: Capillary refill takes less than 2 seconds.  Neurological:     Mental Status: She is alert and oriented to person, place, and time.  Psychiatric:        Mood and Affect: Mood normal.        Behavior: Behavior normal.    ED Results / Procedures / Treatments   Labs (all labs ordered are listed, but only abnormal results are displayed) Labs Reviewed  RESP PANEL BY RT-PCR (FLU A&B, COVID) ARPGX2 - Abnormal; Notable for the following components:      Result Value   Influenza A by PCR POSITIVE (*)    All other components within normal limits    EKG None  Radiology DG Chest 1 View  Result Date: 10/17/2021 CLINICAL DATA:  Chest pain, fever and dizziness. EXAM: CHEST  1 VIEW COMPARISON:  02/10/2019 FINDINGS: The cardiac silhouette, mediastinal and hilar contours are within normal limits. The lungs are clear. No pleural effusions. No pulmonary lesions. The bony thorax is intact. IMPRESSION: No acute cardiopulmonary findings. Electronically Signed   By: Rudie Meyer M.D.   On: 10/17/2021 09:37    Procedures Procedures   Medications Ordered in ED Medications - No data to display  ED Course  I have reviewed the triage vital signs and the nursing notes.  Pertinent labs & imaging results that were available during my care of the patient were reviewed by me and considered in my medical decision making (see chart for details).    MDM Rules/Calculators/A&P                         Patient with URI  symptoms, high fever reported  at home. Was recently prescribed amoxicillin for symptom pneumonia in context of negative COVID test, fever, cough, shortness of breath.  Patient symptoms have been present for 3 days.  We will test for COVID, flu here, chest x-ray, EKG.  EKG only significant for tachycardia, chest x-ray with no acute cardiopulmonary findings.  Patient encouraged to stop taking her amoxicillin.  Respiratory virus panel significant for influenza A.  Patient is outside of the 48-hour window to receive Tamiflu or Xofluza at this time.  Patient encouraged to take Tylenol for fever.  Encouraged to stay out of work or school for 24 hours after she is fever free without the use of Tylenol.  Will prescribe Zofran for nausea.  Patient discharged in stable condition, fever defervesced with Tylenol.  Return precautions were given. Final Clinical Impression(s) / ED Diagnoses Final diagnoses:  Flu    Rx / DC Orders ED Discharge Orders          Ordered    ondansetron (ZOFRAN ODT) 4 MG disintegrating tablet  Every 8 hours PRN        10/17/21 1128             Aleira Deiter, [place] H, PA-C 10/17/21 1629    Tegeler, Canary Brim, MD 10/17/21 2026

## 2021-10-17 NOTE — ED Notes (Signed)
Pt's departure temp was 101.1, stated she'll take tylenol when she gets home. Last tylenol was taken at 730am.

## 2024-11-05 ENCOUNTER — Ambulatory Visit (HOSPITAL_BASED_OUTPATIENT_CLINIC_OR_DEPARTMENT_OTHER)
Admission: EM | Admit: 2024-11-05 | Discharge: 2024-11-05 | Disposition: A | Discharge status: Home / Self Care | Payer: Self-pay | Attending: Family Medicine | Admitting: Family Medicine

## 2024-11-05 ENCOUNTER — Other Ambulatory Visit (HOSPITAL_BASED_OUTPATIENT_CLINIC_OR_DEPARTMENT_OTHER): Payer: Self-pay

## 2024-11-05 ENCOUNTER — Ambulatory Visit (INDEPENDENT_AMBULATORY_CARE_PROVIDER_SITE_OTHER)
Admit: 2024-11-05 | Discharge: 2024-11-05 | Disposition: A | Discharge status: Home / Self Care | Payer: Self-pay | Admitting: Radiology

## 2024-11-05 ENCOUNTER — Encounter (HOSPITAL_BASED_OUTPATIENT_CLINIC_OR_DEPARTMENT_OTHER): Payer: Self-pay

## 2024-11-05 DIAGNOSIS — M5442 Lumbago with sciatica, left side: Secondary | ICD-10-CM | POA: Diagnosis not present

## 2024-11-05 DIAGNOSIS — M533 Sacrococcygeal disorders, not elsewhere classified: Secondary | ICD-10-CM

## 2024-11-05 MED ORDER — METHOCARBAMOL 500 MG PO TABS
500.0000 mg | ORAL_TABLET | Freq: Two times a day (BID) | ORAL | 0 refills | Status: AC
  Filled 2024-11-05: qty 20, 10d supply, fill #0

## 2024-11-05 MED ORDER — METHYLPREDNISOLONE 4 MG PO TBPK
ORAL_TABLET | ORAL | 0 refills | Status: AC
  Filled 2024-11-05: qty 21, 6d supply, fill #0

## 2024-11-05 NOTE — ED Provider Notes (Signed)
 [facility]    CSN: [id number] Arrival date & time: 11/05/24  0900      History   Chief Complaint Chief Complaint  Patient presents with   Back Pain    HPI Victoria Sanders is a 28 y.o. female.   Pt is a 28 year old female that presents with back pain above tail bone since Saturday uncomfortable to sit, feels better to stand. Pt has taken ibuprofen  for the pain.Denies any recent injury/fall.  History of similar in the past with sciatic nerve pain.  The pain is radiating down her left leg.  There is no numbness or tingling.  The leg is sort of weak.  Denies any fever or urinary symptoms.    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 Authorizing Provider  ibuprofen  (ADVIL ) 800 MG tablet Take 800 mg by mouth every 6 (six) hours as needed. 01/21/17  Yes [provider]  methocarbamol (ROBAXIN) 500 MG tablet Take 1 tablet (500 mg total) by mouth 2 (two) times daily. 11/05/24  Yes Vernal Hritz A, FNP  methylPREDNISolone (MEDROL DOSEPAK) 4 MG TBPK tablet Take as directed on package. 11/05/24  Yes Tawnie Ehresman A, FNP  busPIRone (BUSPAR) 10 MG tablet Take 10 mg by mouth 2 (two) times daily. 06/09/17   [provider]  desvenlafaxine (PRISTIQ) 50 MG 24 hr tablet Take 50 mg by  mouth daily.    [provider]  fluticasone  (FLONASE ) 50 MCG/ACT nasal spray Place 2 sprays into both nostrils daily. 05/04/18   Horton, Charmaine FALCON, MD  ondansetron  (ZOFRAN  ODT) 4 MG disintegrating tablet Take 1 tablet (4 mg total) by mouth every 8 (eight) hours as needed for nausea or vomiting. 10/17/21   Prosperi, Sherlean H, PA-C  Prenatal Vit-Fe Fumarate-FA (PRENATAL MULTIVITAMIN) TABS tablet Take 1 tablet by mouth daily at 12 noon.    [provider]  propranolol (INDERAL) 10 MG tablet Take 10 mg by mouth as needed (for anxiety).     [provider]  sertraline (ZOLOFT) 50 MG tablet Take 75 mg by mouth daily.    [provider]  spironolactone (ALDACTONE) 100 MG tablet Take 100 mg by mouth daily.    [provider]  traZODone  (DESYREL ) 50 MG tablet Take 50 mg by mouth at bedtime.    [provider]    Family History Family History  Problem Relation Age of Onset   Diabetes Maternal Uncle    Stroke Maternal Uncle    Cancer  Paternal Grandmother        BRAIN TUMOR    Diabetes Paternal Grandmother    Cancer Paternal Grandfather        LUNG    Healthy Mother     Social History Social History   Tobacco Use   Smoking status: Never   Smokeless tobacco: Never  Vaping Use   Vaping status: Never Used  Substance Use Topics   Alcohol use: No    Alcohol/week: 0.0 standard drinks of alcohol   Drug use: No     Allergies   Patient has no known allergies.   Review of Systems Review of Systems  Musculoskeletal:  Positive for back pain.     Physical Exam Triage Vital Signs ED Triage Vitals  Encounter Vitals Group     BP 11/05/24 0919 112/75     Girls Systolic BP Percentile --      Girls Diastolic BP Percentile --      Boys Systolic BP Percentile --      Boys Diastolic BP Percentile --      Pulse Rate 11/05/24 0919 78     Resp 11/05/24 0919 18     Temp 11/05/24 0919 98.3 F (36.8 C)     Temp Source 11/05/24 0919 Oral      SpO2 11/05/24 0919 97 %     Weight --      Height --      Head Circumference --      Peak Flow --      Pain Score 11/05/24 0916 8     Pain Loc --      Pain Education --      Exclude from Growth Chart --    No data found.  Updated Vital Signs BP 112/75 (BP Location: Right Arm)   Pulse 78   Temp 98.3 F (36.8 C) (Oral)   Resp 18   LMP 10/16/2024 (Exact Date)   SpO2 97%   Visual Acuity Right Eye Distance:   Left Eye Distance:   Bilateral Distance:    Right Eye Near:   Left Eye Near:    Bilateral Near:     Physical Exam Vitals and nursing note reviewed.  Constitutional:      General: She is not in acute distress.    Appearance: Normal appearance. She is not ill-appearing, toxic-appearing or diaphoretic.  Pulmonary:     Effort: Pulmonary effort is normal.  Musculoskeletal:     Lumbar back: Tenderness present. Positive left straight leg raise test.  Neurological:     Mental Status: She is alert.  Psychiatric:        Mood and Affect: Mood normal.      UC Treatments / Results  Labs (all labs ordered are listed, but only abnormal results are displayed) Labs Reviewed - No data to display  EKG   Radiology DG Sacrum/Coccyx Result Date: 11/05/2024 CLINICAL DATA:  Chronic tailbone pain. EXAM: SACRUM AND COCCYX - 2+ VIEW COMPARISON:  None Available. FINDINGS: No acute fracture or dislocation. Sacroiliac joints and pubic symphysis are anatomically aligned. Mild cortical irregularity and sclerosis along the left pubic symphysis. IMPRESSION: 1. No acute fracture. 2. Mild cortical irregularity and sclerosis along the left pubic symphysis, as can be seen in the setting of osteitis pubis. Electronically Signed   By: Harrietta Sherry M.D.   On: 11/05/2024 10:44    Procedures Procedures (including critical care time)  Medications Ordered in UC Medications - No data to display  Initial Impression /  Assessment and Plan / UC Course  I have reviewed the triage vital  signs and the nursing notes.  Pertinent labs & imaging results that were available during my care of the patient were reviewed by me and considered in my medical decision making (see chart for details).     Acute midline lower back pain with left-sided sciatica.  No specific concerns or red flags.  Will treat today with Medrol Dosepak and muscle relaxers as prescribed.Recommend alternating heat and ice to the area.  Gentle stretching which has been included in your packet. For continued issues you will need to follow-up with orthopedics. Final Clinical Impressions(s) / UC Diagnoses   Final diagnoses:  Coccyx pain  Acute midline low back pain with left-sided sciatica     Discharge Instructions      You for your lower back pain with Medrol Dosepak and muscle relaxers as needed.  Recommend alternating heat and ice to the area.  Gentle stretching which has been included in your packet. For continued issues you will need to follow-up with orthopedics.    ED Prescriptions     Medication Sig Dispense Auth. Provider   methylPREDNISolone (MEDROL DOSEPAK) 4 MG TBPK tablet Take as directed on package. 21 each Shannen Vernon A, FNP   methocarbamol (ROBAXIN) 500 MG tablet Take 1 tablet (500 mg total) by mouth 2 (two) times daily. 20 tablet Adah Wilbert LABOR, FNP      PDMP not reviewed this encounter.   Adah Wilbert LABOR, FNP 11/05/24 1223

## 2024-11-05 NOTE — Discharge Instructions (Addendum)
 You for your lower back pain with Medrol Dosepak and muscle relaxers as needed.  Recommend alternating heat and ice to the area.  Gentle stretching which has been included in your packet. For continued issues you will need to follow-up with orthopedics.

## 2024-11-05 NOTE — ED Triage Notes (Addendum)
 Pt c/o back pain above tail bone since Saturday uncomfortable to sit, feels better to stand. Pt has taken ibuprofen  for the pain.Denies any recent injury/fall.
